# Patient Record
Sex: Female | Born: 1940 | Race: White | Smoking: Never smoker
Health system: Southern US, Community
[De-identification: ages and names within clinical notes are randomized; demographics above are authoritative.]

## PROBLEM LIST (undated history)

## (undated) DIAGNOSIS — I10 Essential (primary) hypertension: Secondary | ICD-10-CM

## (undated) DIAGNOSIS — C801 Malignant (primary) neoplasm, unspecified: Secondary | ICD-10-CM

## (undated) HISTORY — PX: JOINT REPLACEMENT: SHX530

## (undated) HISTORY — PX: ABDOMINAL HYSTERECTOMY: SHX81

## (undated) HISTORY — PX: NEPHRECTOMY: SHX65

---

## 2012-10-08 ENCOUNTER — Ambulatory Visit: Payer: Medicare Other | Attending: Orthopaedic Surgery | Admitting: Physical Therapy

## 2012-10-08 DIAGNOSIS — M6281 Muscle weakness (generalized): Secondary | ICD-10-CM | POA: Insufficient documentation

## 2012-10-08 DIAGNOSIS — M25569 Pain in unspecified knee: Secondary | ICD-10-CM | POA: Insufficient documentation

## 2012-10-08 DIAGNOSIS — IMO0001 Reserved for inherently not codable concepts without codable children: Secondary | ICD-10-CM | POA: Insufficient documentation

## 2012-10-08 DIAGNOSIS — M25669 Stiffness of unspecified knee, not elsewhere classified: Secondary | ICD-10-CM | POA: Insufficient documentation

## 2012-10-08 DIAGNOSIS — R269 Unspecified abnormalities of gait and mobility: Secondary | ICD-10-CM | POA: Insufficient documentation

## 2012-10-13 ENCOUNTER — Ambulatory Visit: Payer: Medicare Other | Attending: Orthopaedic Surgery | Admitting: Physical Therapy

## 2012-10-13 DIAGNOSIS — M25569 Pain in unspecified knee: Secondary | ICD-10-CM | POA: Insufficient documentation

## 2012-10-13 DIAGNOSIS — M6281 Muscle weakness (generalized): Secondary | ICD-10-CM | POA: Insufficient documentation

## 2012-10-13 DIAGNOSIS — M25669 Stiffness of unspecified knee, not elsewhere classified: Secondary | ICD-10-CM | POA: Insufficient documentation

## 2012-10-13 DIAGNOSIS — R269 Unspecified abnormalities of gait and mobility: Secondary | ICD-10-CM | POA: Insufficient documentation

## 2012-10-13 DIAGNOSIS — IMO0001 Reserved for inherently not codable concepts without codable children: Secondary | ICD-10-CM | POA: Insufficient documentation

## 2012-10-16 ENCOUNTER — Ambulatory Visit: Payer: Medicare Other | Admitting: Rehabilitation

## 2012-10-20 ENCOUNTER — Ambulatory Visit: Payer: Medicare Other | Admitting: Rehabilitation

## 2012-10-23 ENCOUNTER — Ambulatory Visit: Payer: Medicare Other | Admitting: Physical Therapy

## 2012-10-27 ENCOUNTER — Ambulatory Visit: Payer: Medicare Other | Admitting: Rehabilitation

## 2012-10-30 ENCOUNTER — Ambulatory Visit: Payer: Medicare Other | Admitting: Physical Therapy

## 2012-11-03 ENCOUNTER — Ambulatory Visit: Payer: Medicare Other | Admitting: Rehabilitation

## 2012-11-06 ENCOUNTER — Ambulatory Visit: Payer: Medicare Other | Admitting: Rehabilitation

## 2012-11-10 ENCOUNTER — Ambulatory Visit: Payer: Medicare Other | Admitting: Physical Therapy

## 2012-11-13 ENCOUNTER — Ambulatory Visit: Payer: Medicare Other | Attending: Orthopaedic Surgery | Admitting: Physical Therapy

## 2012-11-13 DIAGNOSIS — IMO0001 Reserved for inherently not codable concepts without codable children: Secondary | ICD-10-CM | POA: Insufficient documentation

## 2012-11-13 DIAGNOSIS — M25669 Stiffness of unspecified knee, not elsewhere classified: Secondary | ICD-10-CM | POA: Insufficient documentation

## 2012-11-13 DIAGNOSIS — R269 Unspecified abnormalities of gait and mobility: Secondary | ICD-10-CM | POA: Insufficient documentation

## 2012-11-13 DIAGNOSIS — M6281 Muscle weakness (generalized): Secondary | ICD-10-CM | POA: Insufficient documentation

## 2012-11-13 DIAGNOSIS — M25569 Pain in unspecified knee: Secondary | ICD-10-CM | POA: Insufficient documentation

## 2012-11-17 ENCOUNTER — Ambulatory Visit: Payer: Medicare Other | Admitting: Physical Therapy

## 2012-11-20 ENCOUNTER — Ambulatory Visit: Payer: Medicare Other | Admitting: Rehabilitation

## 2012-11-24 ENCOUNTER — Ambulatory Visit: Payer: Medicare Other | Admitting: Physical Therapy

## 2012-11-28 ENCOUNTER — Ambulatory Visit: Payer: Medicare Other | Admitting: Physical Therapy

## 2012-12-02 ENCOUNTER — Ambulatory Visit: Payer: Medicare Other | Admitting: Physical Therapy

## 2012-12-05 ENCOUNTER — Ambulatory Visit: Payer: Medicare Other | Admitting: Physical Therapy

## 2012-12-08 ENCOUNTER — Ambulatory Visit: Payer: Medicare Other | Admitting: Physical Therapy

## 2012-12-11 ENCOUNTER — Ambulatory Visit: Payer: Medicare Other | Admitting: Physical Therapy

## 2018-04-19 ENCOUNTER — Observation Stay (HOSPITAL_COMMUNITY): Payer: Medicare Other

## 2018-04-19 ENCOUNTER — Inpatient Hospital Stay (HOSPITAL_BASED_OUTPATIENT_CLINIC_OR_DEPARTMENT_OTHER)
Admission: EM | Admit: 2018-04-19 | Discharge: 2018-04-22 | DRG: 313 | Disposition: A | Discharge status: Home / Self Care | Payer: Medicare Other | Attending: Internal Medicine | Admitting: Internal Medicine

## 2018-04-19 ENCOUNTER — Emergency Department (HOSPITAL_BASED_OUTPATIENT_CLINIC_OR_DEPARTMENT_OTHER): Payer: Medicare Other

## 2018-04-19 ENCOUNTER — Other Ambulatory Visit: Payer: Self-pay

## 2018-04-19 ENCOUNTER — Encounter (HOSPITAL_BASED_OUTPATIENT_CLINIC_OR_DEPARTMENT_OTHER): Payer: Self-pay | Admitting: Emergency Medicine

## 2018-04-19 ENCOUNTER — Observation Stay (HOSPITAL_BASED_OUTPATIENT_CLINIC_OR_DEPARTMENT_OTHER): Payer: Medicare Other

## 2018-04-19 DIAGNOSIS — R109 Unspecified abdominal pain: Secondary | ICD-10-CM | POA: Diagnosis present

## 2018-04-19 DIAGNOSIS — R079 Chest pain, unspecified: Secondary | ICD-10-CM

## 2018-04-19 DIAGNOSIS — Z85528 Personal history of other malignant neoplasm of kidney: Secondary | ICD-10-CM

## 2018-04-19 DIAGNOSIS — Z888 Allergy status to other drugs, medicaments and biological substances status: Secondary | ICD-10-CM | POA: Diagnosis not present

## 2018-04-19 DIAGNOSIS — Z91040 Latex allergy status: Secondary | ICD-10-CM | POA: Diagnosis not present

## 2018-04-19 DIAGNOSIS — R739 Hyperglycemia, unspecified: Secondary | ICD-10-CM | POA: Diagnosis not present

## 2018-04-19 DIAGNOSIS — M549 Dorsalgia, unspecified: Secondary | ICD-10-CM | POA: Diagnosis present

## 2018-04-19 DIAGNOSIS — Z885 Allergy status to narcotic agent status: Secondary | ICD-10-CM

## 2018-04-19 DIAGNOSIS — Z905 Acquired absence of kidney: Secondary | ICD-10-CM

## 2018-04-19 DIAGNOSIS — K760 Fatty (change of) liver, not elsewhere classified: Secondary | ICD-10-CM | POA: Diagnosis not present

## 2018-04-19 DIAGNOSIS — Z79899 Other long term (current) drug therapy: Secondary | ICD-10-CM | POA: Diagnosis not present

## 2018-04-19 DIAGNOSIS — N1330 Unspecified hydronephrosis: Secondary | ICD-10-CM | POA: Diagnosis present

## 2018-04-19 DIAGNOSIS — I129 Hypertensive chronic kidney disease with stage 1 through stage 4 chronic kidney disease, or unspecified chronic kidney disease: Secondary | ICD-10-CM | POA: Diagnosis not present

## 2018-04-19 DIAGNOSIS — K824 Cholesterolosis of gallbladder: Secondary | ICD-10-CM | POA: Diagnosis not present

## 2018-04-19 DIAGNOSIS — F439 Reaction to severe stress, unspecified: Secondary | ICD-10-CM | POA: Diagnosis not present

## 2018-04-19 DIAGNOSIS — Z9660 Presence of unspecified orthopedic joint implant: Secondary | ICD-10-CM | POA: Diagnosis not present

## 2018-04-19 DIAGNOSIS — R1011 Right upper quadrant pain: Secondary | ICD-10-CM | POA: Diagnosis present

## 2018-04-19 DIAGNOSIS — N183 Chronic kidney disease, stage 3 (moderate): Secondary | ICD-10-CM | POA: Diagnosis present

## 2018-04-19 DIAGNOSIS — R0789 Other chest pain: Secondary | ICD-10-CM | POA: Diagnosis not present

## 2018-04-19 DIAGNOSIS — Z881 Allergy status to other antibiotic agents status: Secondary | ICD-10-CM | POA: Diagnosis not present

## 2018-04-19 DIAGNOSIS — R112 Nausea with vomiting, unspecified: Secondary | ICD-10-CM | POA: Diagnosis not present

## 2018-04-19 DIAGNOSIS — K759 Inflammatory liver disease, unspecified: Secondary | ICD-10-CM | POA: Diagnosis not present

## 2018-04-19 DIAGNOSIS — K219 Gastro-esophageal reflux disease without esophagitis: Secondary | ICD-10-CM | POA: Diagnosis not present

## 2018-04-19 HISTORY — DX: Malignant (primary) neoplasm, unspecified: C80.1

## 2018-04-19 HISTORY — DX: Essential (primary) hypertension: I10

## 2018-04-19 LAB — CBC WITH DIFFERENTIAL/PLATELET
Abs Immature Granulocytes: 0.01 10*3/uL (ref 0.00–0.07)
Basophils Absolute: 0.1 10*3/uL (ref 0.0–0.1)
Basophils Relative: 1 %
EOS PCT: 2 %
Eosinophils Absolute: 0.1 10*3/uL (ref 0.0–0.5)
HCT: 41.2 % (ref 36.0–46.0)
HEMOGLOBIN: 12.9 g/dL (ref 12.0–15.0)
Immature Granulocytes: 0 %
Lymphocytes Relative: 27 %
Lymphs Abs: 1.4 10*3/uL (ref 0.7–4.0)
MCH: 27.7 pg (ref 26.0–34.0)
MCHC: 31.3 g/dL (ref 30.0–36.0)
MCV: 88.6 fL (ref 80.0–100.0)
Monocytes Absolute: 0.5 10*3/uL (ref 0.1–1.0)
Monocytes Relative: 9 %
NRBC: 0 % (ref 0.0–0.2)
Neutro Abs: 3.2 10*3/uL (ref 1.7–7.7)
Neutrophils Relative %: 61 %
Platelets: 274 10*3/uL (ref 150–400)
RBC: 4.65 MIL/uL (ref 3.87–5.11)
RDW: 13.1 % (ref 11.5–15.5)
WBC: 5.3 10*3/uL (ref 4.0–10.5)

## 2018-04-19 LAB — LIPASE, BLOOD: LIPASE: 40 U/L (ref 11–51)

## 2018-04-19 LAB — COMPREHENSIVE METABOLIC PANEL
ALT: 13 U/L (ref 0–44)
AST: 17 U/L (ref 15–41)
Albumin: 4.1 g/dL (ref 3.5–5.0)
Alkaline Phosphatase: 65 U/L (ref 38–126)
Anion gap: 9 (ref 5–15)
BUN: 24 mg/dL — ABNORMAL HIGH (ref 8–23)
CO2: 20 mmol/L — AB (ref 22–32)
Calcium: 9.2 mg/dL (ref 8.9–10.3)
Chloride: 108 mmol/L (ref 98–111)
Creatinine, Ser: 1.43 mg/dL — ABNORMAL HIGH (ref 0.44–1.00)
GFR calc Af Amer: 41 mL/min — ABNORMAL LOW (ref >60)
GFR calc non Af Amer: 36 mL/min — ABNORMAL LOW (ref >60)
Glucose, Bld: 102 mg/dL — ABNORMAL HIGH (ref 70–99)
Potassium: 3.9 mmol/L (ref 3.5–5.1)
Sodium: 137 mmol/L (ref 135–145)
Total Bilirubin: 0.9 mg/dL (ref 0.3–1.2)
Total Protein: 7.2 g/dL (ref 6.5–8.1)

## 2018-04-19 LAB — PHOSPHORUS: Phosphorus: 4.6 mg/dL (ref 2.5–4.6)

## 2018-04-19 LAB — TROPONIN I
Troponin I: <0.03 ng/mL (ref <0.03)
Troponin I: <0.03 ng/mL (ref <0.03)

## 2018-04-19 LAB — TSH: TSH: 2.484 u[IU]/mL (ref 0.350–4.500)

## 2018-04-19 LAB — D-DIMER, QUANTITATIVE: D-Dimer, Quant: 0.4 ug/mL-FEU (ref 0.00–0.50)

## 2018-04-19 LAB — MAGNESIUM: Magnesium: 2.2 mg/dL (ref 1.7–2.4)

## 2018-04-19 MED ORDER — ONDANSETRON HCL 4 MG/2ML IJ SOLN: 4.0000 mg | Freq: Four times a day (QID) | INTRAMUSCULAR | Status: DC | PRN

## 2018-04-19 MED ORDER — SENNOSIDES-DOCUSATE SODIUM 8.6-50 MG PO TABS
1.0000 | ORAL_TABLET | Freq: Every evening | ORAL | Status: DC | PRN
  Administered 2018-04-21: 1 via ORAL
  Filled 2018-04-19: qty 1

## 2018-04-19 MED ORDER — MORPHINE SULFATE (PF) 4 MG/ML IV SOLN
4.0000 mg | Freq: Once | INTRAVENOUS | Status: AC
  Administered 2018-04-19: 4 mg via INTRAVENOUS
  Filled 2018-04-19: qty 1

## 2018-04-19 MED ORDER — ACETAMINOPHEN 325 MG PO TABS: 650.0000 mg | ORAL_TABLET | Freq: Four times a day (QID) | ORAL | Status: DC | PRN

## 2018-04-19 MED ORDER — ONDANSETRON HCL 4 MG/2ML IJ SOLN
4.0000 mg | Freq: Once | INTRAMUSCULAR | Status: AC
  Administered 2018-04-19: 4 mg via INTRAVENOUS
  Filled 2018-04-19: qty 2

## 2018-04-19 MED ORDER — OXYCODONE HCL 5 MG PO TABS
5.0000 mg | ORAL_TABLET | ORAL | Status: DC | PRN
  Administered 2018-04-19 – 2018-04-21 (×6): 5 mg via ORAL
  Filled 2018-04-19 (×7): qty 1

## 2018-04-19 MED ORDER — BISACODYL 10 MG RE SUPP: 10.0000 mg | Freq: Every day | RECTAL | Status: DC | PRN

## 2018-04-19 MED ORDER — ENOXAPARIN SODIUM 40 MG/0.4ML ~~LOC~~ SOLN: 40.0000 mg | Freq: Every day | SUBCUTANEOUS | Status: DC

## 2018-04-19 MED ORDER — ONDANSETRON HCL 4 MG PO TABS: 4.0000 mg | ORAL_TABLET | Freq: Four times a day (QID) | ORAL | Status: DC | PRN

## 2018-04-19 MED ORDER — PROMETHAZINE HCL 25 MG/ML IJ SOLN
12.5000 mg | Freq: Once | INTRAMUSCULAR | Status: AC
  Administered 2018-04-19: 12.5 mg via INTRAVENOUS
  Filled 2018-04-19: qty 1

## 2018-04-19 MED ORDER — HEPARIN SODIUM (PORCINE) 5000 UNIT/ML IJ SOLN
5000.0000 [IU] | Freq: Three times a day (TID) | INTRAMUSCULAR | Status: DC
  Administered 2018-04-19 – 2018-04-21 (×5): 5000 [IU] via SUBCUTANEOUS
  Filled 2018-04-19 (×7): qty 1

## 2018-04-19 MED ORDER — SODIUM CHLORIDE 0.9 % IV SOLN
INTRAVENOUS | Status: DC
  Administered 2018-04-19 – 2018-04-21 (×4): via INTRAVENOUS

## 2018-04-19 MED ORDER — ACETAMINOPHEN 650 MG RE SUPP: 650.0000 mg | Freq: Four times a day (QID) | RECTAL | Status: DC | PRN

## 2018-04-19 MED ORDER — ONDANSETRON HCL 4 MG/2ML IJ SOLN
4.0000 mg | Freq: Four times a day (QID) | INTRAMUSCULAR | Status: DC
  Administered 2018-04-20 – 2018-04-21 (×8): 4 mg via INTRAVENOUS
  Filled 2018-04-19 (×10): qty 2

## 2018-04-19 NOTE — ED Provider Notes (Signed)
[facility] EMERGENCY DEPARTMENT Provider Note   CSN: [id number] Arrival date & time: 04/19/18  1443     History   Chief Complaint Chief Complaint  Patient presents with  . Chest Pain    HPI Shelley Freeman is a 77 y.o. female.  She is presenting with some right-sided anterior chest pain radiating through to her back is been going on and off since Thanksgiving.  She said over the last few days it is been more constant.  It does not associated with shortness of breath and it does not seem to be related to exertion or eating.  She rates the pain is 8 out of 10.  She calls it uncomfortable.  She is never had this before.  No trauma.  No fevers or chills.  No nausea no vomiting no diarrhea no urinary symptoms.  She said she has had a mammogram recently and saw her PCP and was told everything was fine.  She does not notice any symptoms related to the breast that is deeper down inside on her right lower chest.  The history is provided by the patient.  Chest Pain   This is a new problem. The current episode started more than 1 week ago. The problem occurs constantly. The problem has not changed since onset.The pain is present in the lateral region. The pain is at a severity of 8/10. Quality: uncomfortable. The pain radiates to the mid back. Exacerbated by: nothing. Pertinent negatives include no abdominal pain, no cough, no diaphoresis, no exertional chest pressure, no fever, no leg pain, no lower extremity edema, no nausea, no numbness, no shortness of breath and no vomiting. She has tried nothing for the symptoms. The treatment provided no relief.  Her past medical history is significant for cancer.    Past Medical History:  Diagnosis Date  . Cancer ([place])   . Hypertension     There are no active problems to display for this patient.   Past Surgical History:  Procedure Laterality Date  . ABDOMINAL HYSTERECTOMY    . JOINT REPLACEMENT    . NEPHRECTOMY Left      OB History     None      Home Medications    Prior to Admission medications   Not on File    Family History No family history on file.  Social History Social History   Tobacco Use  . Smoking status: Never Smoker  . Smokeless tobacco: Never Used  Substance Use Topics  . Alcohol use: Never    Frequency: Never  . Drug use: Never     Allergies   Codeine; Demerol [meperidine hcl]; and Lipitor [atorvastatin calcium]   Review of Systems Review of Systems  Constitutional: Negative for diaphoresis and fever.  HENT: Negative for sore throat.   Eyes: Negative for visual disturbance.  Respiratory: Negative for cough and shortness of breath.   Cardiovascular: Positive for chest pain.  Gastrointestinal: Negative for abdominal pain, nausea and vomiting.  Genitourinary: Negative for dysuria.  Musculoskeletal: Negative for neck pain.  Skin: Negative for rash.  Neurological: Negative for numbness.     Physical Exam Updated Vital Signs BP (!) 153/76   Pulse 65   Temp 98.3 F (36.8 C) (Oral)   Resp 19   Ht 5\' 5"  (1.651 m)   Wt 68 kg   SpO2 98%   BMI 24.96 kg/m   Physical Exam  Constitutional: She appears well-developed and well-nourished. No distress.  HENT:  Head: Normocephalic and atraumatic.  Eyes: Conjunctivae are normal.  Neck: Neck supple.  Cardiovascular: Normal rate and regular rhythm.  No murmur heard. Pulmonary/Chest: Effort normal and breath sounds normal. No respiratory distress.  Abdominal: Soft. There is tenderness (mild) in the right upper quadrant. There is no rigidity, no guarding, no tenderness at McBurney's point and negative Murphy's sign.  Musculoskeletal: She exhibits no edema.       Right lower leg: She exhibits no tenderness and no edema.       Left lower leg: She exhibits no tenderness and no edema.  Neurological: She is alert.  Skin: Skin is warm and dry.  Psychiatric: She has a normal mood and affect.  Nursing note and vitals reviewed.    ED  Treatments / Results  Labs (all labs ordered are listed, but only abnormal results are displayed) Labs Reviewed  COMPREHENSIVE METABOLIC PANEL - Abnormal; Notable for the following components:      Result Value   CO2 20 (*)    Glucose, Bld 102 (*)    BUN 24 (*)    Creatinine, Ser 1.43 (*)    GFR calc non Af Amer 36 (*)    GFR calc Af Amer 41 (*)    All other components within normal limits  TROPONIN I  CBC WITH DIFFERENTIAL/PLATELET  LIPASE, BLOOD  TROPONIN I  D-DIMER, QUANTITATIVE (NOT AT [facility])  URINALYSIS, ROUTINE W REFLEX MICROSCOPIC  TROPONIN I    EKG EKG Interpretation  Date/Time:  Saturday April 19 2018 08:48:48 EST Ventricular Rate:  57 PR Interval:    QRS Duration: 98 QT Interval:  448 QTC Calculation: 437 R Axis:   1 Text Interpretation:  Sinus rhythm Inferior infarct, old no prior to compare with Confirmed by Aletta Edouard [phone]) on 04/19/2018 9:00:08 AM   Radiology Ct Abdomen Pelvis Wo Contrast  Result Date: 04/19/2018 CLINICAL DATA:  Right upper quadrant and flank pain. EXAM: CT ABDOMEN AND PELVIS WITHOUT CONTRAST TECHNIQUE: Multidetector CT imaging of the abdomen and pelvis was performed following the standard protocol without IV contrast. COMPARISON:  Right upper quadrant ultrasound from same day. CT abdomen pelvis dated October 25, 2017. FINDINGS: Lower chest: No acute abnormality. Subsegmental atelectasis in both lower lobes. Hepatobiliary: No focal liver abnormality is seen. No gallstones, gallbladder wall thickening, or biliary dilatation. Pancreas: Unremarkable. No pancreatic ductal dilatation or surrounding inflammatory changes. Spleen: Normal in size without focal abnormality. Adrenals/Urinary Tract: The adrenal glands are unremarkable. Prior left nephrectomy. Unchanged right renal cysts. Fullness of the right renal collecting system to the level of the UPJ is unchanged since 2017. No renal or ureteral calculi. No hydronephrosis. The bladder is  unremarkable. Stomach/Bowel: Stomach is within normal limits. Appendix appears normal. No evidence of bowel wall thickening, distention, or inflammatory changes. Vascular/Lymphatic: Aortic atherosclerosis. No enlarged abdominal or pelvic lymph nodes. Reproductive: Status post hysterectomy. No adnexal masses. Other: No free fluid or pneumoperitoneum. Musculoskeletal: No acute or significant osseous findings. IMPRESSION: 1.  No acute intra-abdominal process. 2. Fullness of the right renal collecting system to the level of the UPJ is unchanged since 2017. No obstructive uropathy. 3.  Aortic atherosclerosis (ICD10-I70.0). Electronically Signed   By: Titus Dubin M.D.   On: 04/19/2018 13:08   Dg Chest 2 View  Result Date: 04/19/2018 CLINICAL DATA:  Right-sided chest pain for 9 days. EXAM: CHEST - 2 VIEW COMPARISON:  None. FINDINGS: The heart size and mediastinal contours are within normal limits. Both lungs are clear. The visualized skeletal structures are unremarkable. IMPRESSION: No active  cardiopulmonary disease. Electronically Signed   By: Earle Gell M.D.   On: 04/19/2018 09:51   US Abdomen Limited Ruq  Result Date: 04/19/2018 CLINICAL DATA:  Epigastric abdominal pain. EXAM: ULTRASOUND ABDOMEN LIMITED RIGHT UPPER QUADRANT COMPARISON:  None. FINDINGS: Gallbladder: Suspect polyp or fold near the gallbladder neck. No shadowing gallstones or findings for acute cholecystitis. Common bile duct: Diameter: 7.0 mm Liver: Somewhat heterogeneous coarse liver echogenicity but no focal hepatic lesions or intrahepatic biliary dilatation. Other: The right kidney demonstrates hydronephrosis. There are also simple appearing right renal cysts. Portal vein is patent on color Doppler imaging with normal direction of blood flow towards the liver. IMPRESSION: 1. Suspect polyp or fold near the gallbladder neck but no shadowing gallstones or findings for acute cholecystitis. 2. Normal caliber common bile duct for age. 3.  Somewhat heterogeneous coarse liver echogenicity could be due to fatty infiltration or hepatitis. No obvious changes of cirrhosis. 4. Right-sided hydronephrosis of uncertain etiology. Electronically Signed   By: Marijo Sanes M.D.   On: 04/19/2018 11:50    Procedures Procedures (including critical care time)  Medications Ordered in ED Medications  oxyCODONE (Oxy IR/ROXICODONE) immediate release tablet 5 mg (has no administration in time range)  ondansetron (ZOFRAN) injection 4 mg (has no administration in time range)  morphine 4 MG/ML injection 4 mg (4 mg Intravenous Given 04/19/18 0908)  ondansetron (ZOFRAN) injection 4 mg (4 mg Intravenous Given 04/19/18 0908)  morphine 4 MG/ML injection 4 mg (4 mg Intravenous Given 04/19/18 1252)  ondansetron (ZOFRAN) injection 4 mg (4 mg Intravenous Given 04/19/18 1252)  morphine 4 MG/ML injection 4 mg (4 mg Intravenous Given 04/19/18 1434)  ondansetron (ZOFRAN) injection 4 mg (4 mg Intravenous Given 04/19/18 1434)  promethazine (PHENERGAN) injection 12.5 mg (12.5 mg Intravenous Given 04/19/18 1529)     Initial Impression / Assessment and Plan / ED Course  I have reviewed the triage vital signs and the nursing notes.  Pertinent labs & imaging results that were available during my care of the patient were reviewed by me and considered in my medical decision making (see chart for details).  Clinical Course as of Apr 19 1709  Sat Apr 19, 2632  6056 77 year old female with remote history of renal cell cancer here with right-sided chest pain that is been going on for over a week but more steady over the past few days.  Seems to be centered beneath her right breast and it radiates into her back.  She is got some element of some right upper quadrant tenderness and this could be related to her gallbladder although she will need further cardiac testing and a chest x-ray to rule out pulmonary processes   [MB]  1007 Patient's creatinine is a little bit elevated at  1.43 although in review of care everywhere that seems to be her new baseline recently.  Otherwise her LFTs are normal troponin normal.  I put her in for right upper quadrant ultrasound.   [MB]  3810 I reviewed the patient's findings with her.  I think the next step would be to do a CT although she will not be able to have contrast due to her decreased kidney function and solitary kidney.  She understands this and is in agreement to undergo the noncontrast CT.   [MB]  1408 Discussed with Dr. Oletta Lamas from [facility].  He agree that there may be indications to do a HIDA scan but does not think would be available over the weekend.  He would  have the patient call his office on Monday to be seen and they would work on arranging this.   [MB]  6010 Had a long discussion with the patient and she understands that organ to try to control her pain as an outpatient and have her follow-up with GI on Monday.  She understands if her symptoms worsen or she experiences any symptoms that are concerning that she should return to the emergency department.  I let her know that she may be better served by going to [facility] or [facility] as they will be able to have for options available for further testing.   [MB]  1459 Patient is still having pain despite another dose of morphine.  Still right anterior lower chest and through to her scapula.  I have put a call into the hospitalist to discuss admission for her.   [MB]  1521 discussed with Dr. Loleta Books at [facility] long hospitalist.  He is accepting the patient for admission at [facility] long and asked if we would stop giving IV pain medicine and convert her over to oral pain medicine and get a d-dimer.   [MB]    Clinical Course User Index [MB] Hayden Rasmussen, MD    Final Clinical Impressions(s) / ED Diagnoses   Final diagnoses:  Right-sided chest pain    ED Discharge Orders    None       Hayden Rasmussen, MD 04/19/18 1711

## 2018-04-19 NOTE — ED Triage Notes (Signed)
Pain to right chest area, worse since yesterday. On and off since Thanksgiving, pain goes to back

## 2018-04-19 NOTE — ED Notes (Signed)
Pt unable to provide urine sample at this time. Provided water as requested

## 2018-04-19 NOTE — ED Notes (Signed)
Pt ambulatory to [facility] with assistance

## 2018-04-19 NOTE — Progress Notes (Signed)
[facility] Transfer Accept Note  Shelley Freeman is a 77 y.o. F with HTN, RCC s/p L nephrectomy and CKD III baseline Cr 1.3-1.5 who presents with several weeks progressive right sided chest pain.  Patient in [place] until a few weeks ago. Started to notice right sided chest pain, vague character, but radiating to scapula, positional (worse lying down).  NOT exertional nor particularly food related.    Now has become severe, more constant.     CXR negative.  Afebrile. RR and SpO2 normal.  Troponin initial normal.  ECG sinus rhythm, no real ST changes. Cr at baseline (1.4).  LFTs and lipase normal.   RUQ US showed gallbladder polyp only, mild echogenicity of liver, and mild hydronephrosis on RIGHT, but follow up CT without contrast showed stable known "fullness" of right ureter, NO obstruction or hydro.  Patient requiring multiple doses of morphine in ER, pain etiology unclear.  With cholecystitis, pancreatitis, renal stone all ruled out, differential I believe includes PE, x-ray negative pneumonia.  Probably less likely pericarditis, even less likely ACS.  I recommend d-dimer and transfer for OBS, further eval.    If dimer positive, will need empiric AC and VQ scan.  If PE ruled out, will need echo and CT chest wo cm.

## 2018-04-19 NOTE — H&P (Signed)
History and Physical    Shelley Freeman LFY:[id number] DOB: 07-30-1940 DOA: 04/19/2018  PCP: Burman Freestone, MD   Patient coming from: Home  Chief Complaint: Intractable Right Sided Chest Pain  HPI: Shelley Freeman is a 77 y.o. female with medical history significant of renal cell carcinoma status post left nephrectomy, chronic kidney disease stage III, history of GERD who takes intermittent omeprazole, and other comorbidities who presented to the emergency room with a chief complaint of intractable right-sided chest pain that started off his abdominal pain.  Patient states that after he ate Thanksgiving dinner symptoms all started and chest pain in the gastric pain persisted and is constant.  She states that the pain is now settled in the right side of chest and radiates to the back.  She denies any nausea or vomiting.  And she has tried omeprazole without relief.  Because of the pain persisting it was a 7-8 out of 10 she presented to the emergency room for further evaluation and work-up.  [facility] was called to admit this patient for right-sided chest pain  ED Course: In the ED she had basic blood work done, was given 12 mg of IV morphine, and 5 mg of p.o. oxycodone, given dose of Zofran and Phenergan, and had an ultrasound of her abdomen as well as a CT of the abdomen and pelvis  Review of Systems: As per HPI otherwise 10 point review of systems negative.   Past Medical History:  Diagnosis Date  . Cancer ([place])   . Hypertension    Past Surgical History:  Procedure Laterality Date  . ABDOMINAL HYSTERECTOMY    . JOINT REPLACEMENT    . NEPHRECTOMY Left    SOCIAL HISTORY  reports that she has never smoked. She has never used smokeless tobacco. She reports that she does not drink alcohol or use drugs.  Allergies  Allergen Reactions  . Codeine Nausea Only  . Demerol [Meperidine Hcl] Nausea Only  . Lipitor [Atorvastatin Calcium] Other (See Comments)    Muscle weakness   FAMILY HISTORY History  reviewed. No pertinent family history to patient's presenting complaints  Prior to Admission medications   Not on File   Physical Exam: Vitals:   04/19/18 1113 04/19/18 1356 04/19/18 1531 04/19/18 1700  BP: 140/68 114/61 (!) 119/53 129/62  Pulse: (!) 55 (!) 51  64  Resp: 15 13 12 14   Temp: 97.9 F (36.6 C) 97.7 F (36.5 C)  97.7 F (36.5 C)  TempSrc: Oral Oral  Oral  SpO2: 100% 94%  98%  Weight:    70.8 kg  Height:    5\' 5"  (1.651 m)   Constitutional: WN/WD Caucasian female in NAD and appears calm and comfortable but complaining of Right Sided Chest Pain 7/10 Eyes: Lids and conjunctivae normal, sclerae anicteric  ENMT: External Ears, Nose appear normal. Grossly normal hearing.  Neck: Appears normal, supple, no cervical masses, normal ROM, no appreciable thyromegaly; no JVD Respiratory: Diminished to auscultation bilaterally, no wheezing, rales, rhonchi or crackles. Normal respiratory effort and patient is not tachypenic. No accessory muscle use.  Cardiovascular: RRR, no murmurs / rubs / gallops. S1 and S2 auscultated. No extremity edema.  Abdomen: Soft, non-tender, non-distended. No masses palpated. No appreciable hepatosplenomegaly. Bowel sounds positive x4.  GU: Deferred. Musculoskeletal: No clubbing / cyanosis of digits/nails. .  Skin: No rashes, lesions, ulcers on a limited skin evaluation. No induration; Warm and dry.  Neurologic: CN 2-12 grossly intact with no focal deficits. Romberg sign and cerebellar reflexes  not assessed.  Psychiatric: Normal judgment and insight. Alert and oriented x 3. Normal mood and appropriate affect.   Labs on Admission: I have personally reviewed following labs and imaging studies  CBC: Recent Labs  Lab 04/19/18 0854  WBC 5.3  NEUTROABS 3.2  HGB 12.9  HCT 41.2  MCV 88.6  PLT 660   Basic Metabolic Panel: Recent Labs  Lab 04/19/18 0854  NA 137  K 3.9  CL 108  CO2 20*  GLUCOSE 102*  BUN 24*  CREATININE 1.43*  CALCIUM 9.2    GFR: Estimated Creatinine Clearance: 33.5 mL/min (A) (by C-G formula based on SCr of 1.43 mg/dL (H)). Liver Function Tests: Recent Labs  Lab 04/19/18 0854  AST 17  ALT 13  ALKPHOS 65  BILITOT 0.9  PROT 7.2  ALBUMIN 4.1   Recent Labs  Lab 04/19/18 0854  LIPASE 40   No results for input(s): AMMONIA in the last 168 hours. Coagulation Profile: No results for input(s): INR, PROTIME in the last 168 hours. Cardiac Enzymes: Recent Labs  Lab 04/19/18 0854 04/19/18 1524  TROPONINI <0.03 <0.03   BNP (last 3 results) No results for input(s): PROBNP in the last 8760 hours. HbA1C: No results for input(s): HGBA1C in the last 72 hours. CBG: No results for input(s): GLUCAP in the last 168 hours. Lipid Profile: No results for input(s): CHOL, HDL, LDLCALC, TRIG, CHOLHDL, LDLDIRECT in the last 72 hours. Thyroid Function Tests: No results for input(s): TSH, T4TOTAL, FREET4, T3FREE, THYROIDAB in the last 72 hours. Anemia Panel: No results for input(s): VITAMINB12, FOLATE, FERRITIN, TIBC, IRON, RETICCTPCT in the last 72 hours. Urine analysis: No results found for: COLORURINE, APPEARANCEUR, LABSPEC, PHURINE, GLUCOSEU, HGBUR, BILIRUBINUR, KETONESUR, PROTEINUR, UROBILINOGEN, NITRITE, LEUKOCYTESUR Sepsis Labs: !!!!!!!!!!!!!!!!!!!!!!!!!!!!!!!!!!!!!!!!!!!! @LABRCNTIP (procalcitonin:4,lacticidven:4) )No results found for this or any previous visit (from the past 240 hour(s)).   Radiological Exams on Admission: Ct Abdomen Pelvis Wo Contrast  Result Date: 04/19/2018 CLINICAL DATA:  Right upper quadrant and flank pain. EXAM: CT ABDOMEN AND PELVIS WITHOUT CONTRAST TECHNIQUE: Multidetector CT imaging of the abdomen and pelvis was performed following the standard protocol without IV contrast. COMPARISON:  Right upper quadrant ultrasound from same day. CT abdomen pelvis dated October 25, 2017. FINDINGS: Lower chest: No acute abnormality. Subsegmental atelectasis in both lower lobes. Hepatobiliary: No  focal liver abnormality is seen. No gallstones, gallbladder wall thickening, or biliary dilatation. Pancreas: Unremarkable. No pancreatic ductal dilatation or surrounding inflammatory changes. Spleen: Normal in size without focal abnormality. Adrenals/Urinary Tract: The adrenal glands are unremarkable. Prior left nephrectomy. Unchanged right renal cysts. Fullness of the right renal collecting system to the level of the UPJ is unchanged since 2017. No renal or ureteral calculi. No hydronephrosis. The bladder is unremarkable. Stomach/Bowel: Stomach is within normal limits. Appendix appears normal. No evidence of bowel wall thickening, distention, or inflammatory changes. Vascular/Lymphatic: Aortic atherosclerosis. No enlarged abdominal or pelvic lymph nodes. Reproductive: Status post hysterectomy. No adnexal masses. Other: No free fluid or pneumoperitoneum. Musculoskeletal: No acute or significant osseous findings. IMPRESSION: 1.  No acute intra-abdominal process. 2. Fullness of the right renal collecting system to the level of the UPJ is unchanged since 2017. No obstructive uropathy. 3.  Aortic atherosclerosis (ICD10-I70.0). Electronically Signed   By: Titus Dubin M.D.   On: 04/19/2018 13:08   Dg Chest 2 View  Result Date: 04/19/2018 CLINICAL DATA:  Right-sided chest pain for 9 days. EXAM: CHEST - 2 VIEW COMPARISON:  None. FINDINGS: The heart size and mediastinal contours are within normal  limits. Both lungs are clear. The visualized skeletal structures are unremarkable. IMPRESSION: No active cardiopulmonary disease. Electronically Signed   By: Earle Gell M.D.   On: 04/19/2018 09:51   US Abdomen Limited Ruq  Result Date: 04/19/2018 CLINICAL DATA:  Epigastric abdominal pain. EXAM: ULTRASOUND ABDOMEN LIMITED RIGHT UPPER QUADRANT COMPARISON:  None. FINDINGS: Gallbladder: Suspect polyp or fold near the gallbladder neck. No shadowing gallstones or findings for acute cholecystitis. Common bile duct: Diameter:  7.0 mm Liver: Somewhat heterogeneous coarse liver echogenicity but no focal hepatic lesions or intrahepatic biliary dilatation. Other: The right kidney demonstrates hydronephrosis. There are also simple appearing right renal cysts. Portal vein is patent on color Doppler imaging with normal direction of blood flow towards the liver. IMPRESSION: 1. Suspect polyp or fold near the gallbladder neck but no shadowing gallstones or findings for acute cholecystitis. 2. Normal caliber common bile duct for age. 3. Somewhat heterogeneous coarse liver echogenicity could be due to fatty infiltration or hepatitis. No obvious changes of cirrhosis. 4. Right-sided hydronephrosis of uncertain etiology. Electronically Signed   By: Marijo Sanes M.D.   On: 04/19/2018 11:50   EKG: Independently reviewed. Showed Sinus Bradycardia at a rate of 57. No evidenc of ST Elevation on my interpretation  Assessment/Plan Active Problems:   Chest pain  Right Sided Chest Pain and Radiation to the back/Recent Gastric Pain  -Place in Observation Telemetry -Patient has Tried Omeprazole with no relief -Symptoms started after Thanksgiving dinner and persisted  -Lipase and LFT's normal -Does not have a rash and no evidence of Shingles but could be starting; Will ask about Chicken Pox  -D-Dimer Normal 0.40 -Check CT of the Chest w/o Contrast -Check ECHOCardiogram -RUQ U/S Showed gallbladder polyp or fold only, mild echogenicity of liver from fatty infiltration or hepatitis, and mild hydronephrosis on RIGHT -CT Abd/Pelvis showed acute intra-abdominal process.  Fullness of the right renal collecting system to the level of the UPJ was unchanged since 2017 and no obstructive uropathy.  There is also aortic atherosclerosis noted -? May need HIDA and gastroenterology Dr. Oletta Lamas from [facility] was contacted and he agrees there may be indication to do a HIDA scan but does not think it is available over this week and he is arranging the patient  call his office on Monday to be seen and would be working on arranging this -Cycle Cardiac Troponins, First two were <0.03  -Pain Control with Oxycodone -Fluid hydration with normal saline rate to 75 mils per hour  CKD Stage 3 -Baseline Cr is 1.3-15 -Cr on Admission was 1.43 and BUN was 24 -Gentle IVF Hydration -Follow up CMP in AM   Hx of RCC s/p Left Nephrectomy -Follow up as an outpatient  Hyperglycemia -Check HbA1c -Continue to Monitor Blood Sugars and place on SSI if consistently elevated  DVT prophylaxis: Heparin 5,000 units sq q8h Code Status: FULL CODE Family Communication: No family present at bedside Disposition Plan: Anticipate D/C Consults called: Enterology Dr. Oletta Lamas was called by EDP Admission status: Observation telemetry  Severity of Illness: The appropriate patient status for this patient is OBSERVATION. Observation status is judged to be reasonable and necessary in order to provide the required intensity of service to ensure the patient's safety. The patient's presenting symptoms, physical exam findings, and initial radiographic and laboratory data in the context of their medical condition is felt to place them at decreased risk for further clinical deterioration. Furthermore, it is anticipated that the patient will be medically stable for discharge from the  hospital within 2 midnights of admission. The following factors support the patient status of observation.   " The patient's presenting symptoms include right-sided chest pain radiating to back. " The physical exam findings include a slow heart rate slightly. " The initial radiographic and laboratory data are reassuring.  Kerney Elbe, D.O. [facility] is on [place]  If 7PM-7AM, please contact night-coverage [email or web address] Password [facility]  04/19/2018, 5:31 PM

## 2018-04-20 ENCOUNTER — Observation Stay (HOSPITAL_BASED_OUTPATIENT_CLINIC_OR_DEPARTMENT_OTHER): Payer: Medicare Other

## 2018-04-20 DIAGNOSIS — F439 Reaction to severe stress, unspecified: Secondary | ICD-10-CM | POA: Diagnosis present

## 2018-04-20 DIAGNOSIS — Z905 Acquired absence of kidney: Secondary | ICD-10-CM | POA: Diagnosis not present

## 2018-04-20 DIAGNOSIS — R112 Nausea with vomiting, unspecified: Secondary | ICD-10-CM | POA: Diagnosis present

## 2018-04-20 DIAGNOSIS — N1330 Unspecified hydronephrosis: Secondary | ICD-10-CM | POA: Diagnosis present

## 2018-04-20 DIAGNOSIS — R739 Hyperglycemia, unspecified: Secondary | ICD-10-CM | POA: Diagnosis present

## 2018-04-20 DIAGNOSIS — K219 Gastro-esophageal reflux disease without esophagitis: Secondary | ICD-10-CM | POA: Diagnosis present

## 2018-04-20 DIAGNOSIS — I351 Nonrheumatic aortic (valve) insufficiency: Secondary | ICD-10-CM | POA: Diagnosis not present

## 2018-04-20 DIAGNOSIS — Z91040 Latex allergy status: Secondary | ICD-10-CM | POA: Diagnosis not present

## 2018-04-20 DIAGNOSIS — R0789 Other chest pain: Secondary | ICD-10-CM | POA: Diagnosis present

## 2018-04-20 DIAGNOSIS — Z888 Allergy status to other drugs, medicaments and biological substances status: Secondary | ICD-10-CM | POA: Diagnosis not present

## 2018-04-20 DIAGNOSIS — R1011 Right upper quadrant pain: Secondary | ICD-10-CM

## 2018-04-20 DIAGNOSIS — Z885 Allergy status to narcotic agent status: Secondary | ICD-10-CM | POA: Diagnosis not present

## 2018-04-20 DIAGNOSIS — Z881 Allergy status to other antibiotic agents status: Secondary | ICD-10-CM | POA: Diagnosis not present

## 2018-04-20 DIAGNOSIS — R109 Unspecified abdominal pain: Secondary | ICD-10-CM | POA: Diagnosis present

## 2018-04-20 DIAGNOSIS — K824 Cholesterolosis of gallbladder: Secondary | ICD-10-CM | POA: Diagnosis present

## 2018-04-20 DIAGNOSIS — I129 Hypertensive chronic kidney disease with stage 1 through stage 4 chronic kidney disease, or unspecified chronic kidney disease: Secondary | ICD-10-CM | POA: Diagnosis present

## 2018-04-20 DIAGNOSIS — Z9660 Presence of unspecified orthopedic joint implant: Secondary | ICD-10-CM | POA: Diagnosis present

## 2018-04-20 DIAGNOSIS — K759 Inflammatory liver disease, unspecified: Secondary | ICD-10-CM | POA: Diagnosis present

## 2018-04-20 DIAGNOSIS — R079 Chest pain, unspecified: Secondary | ICD-10-CM | POA: Diagnosis not present

## 2018-04-20 DIAGNOSIS — Z85528 Personal history of other malignant neoplasm of kidney: Secondary | ICD-10-CM | POA: Diagnosis not present

## 2018-04-20 DIAGNOSIS — K760 Fatty (change of) liver, not elsewhere classified: Secondary | ICD-10-CM | POA: Diagnosis present

## 2018-04-20 DIAGNOSIS — Z79899 Other long term (current) drug therapy: Secondary | ICD-10-CM | POA: Diagnosis not present

## 2018-04-20 DIAGNOSIS — M549 Dorsalgia, unspecified: Secondary | ICD-10-CM | POA: Diagnosis present

## 2018-04-20 DIAGNOSIS — N183 Chronic kidney disease, stage 3 (moderate): Secondary | ICD-10-CM | POA: Diagnosis present

## 2018-04-20 LAB — URINALYSIS, COMPLETE (UACMP) WITH MICROSCOPIC
Bilirubin Urine: NEGATIVE
Glucose, UA: NEGATIVE mg/dL
Hgb urine dipstick: NEGATIVE
Ketones, ur: NEGATIVE mg/dL
Nitrite: NEGATIVE
PH: 5 (ref 5.0–8.0)
Protein, ur: NEGATIVE mg/dL
Specific Gravity, Urine: 1.027 (ref 1.005–1.030)

## 2018-04-20 LAB — CBC
HCT: 36.7 % (ref 36.0–46.0)
Hemoglobin: 11.1 g/dL — ABNORMAL LOW (ref 12.0–15.0)
MCH: 27.7 pg (ref 26.0–34.0)
MCHC: 30.2 g/dL (ref 30.0–36.0)
MCV: 91.5 fL (ref 80.0–100.0)
NRBC: 0 % (ref 0.0–0.2)
Platelets: 247 10*3/uL (ref 150–400)
RBC: 4.01 MIL/uL (ref 3.87–5.11)
RDW: 13.2 % (ref 11.5–15.5)
WBC: 4.7 10*3/uL (ref 4.0–10.5)

## 2018-04-20 LAB — COMPREHENSIVE METABOLIC PANEL
ALT: 14 U/L (ref 0–44)
AST: 16 U/L (ref 15–41)
Albumin: 3.5 g/dL (ref 3.5–5.0)
Alkaline Phosphatase: 54 U/L (ref 38–126)
Anion gap: 9 (ref 5–15)
BILIRUBIN TOTAL: 0.3 mg/dL (ref 0.3–1.2)
BUN: 24 mg/dL — ABNORMAL HIGH (ref 8–23)
CO2: 24 mmol/L (ref 22–32)
Calcium: 8.5 mg/dL — ABNORMAL LOW (ref 8.9–10.3)
Chloride: 107 mmol/L (ref 98–111)
Creatinine, Ser: 1.59 mg/dL — ABNORMAL HIGH (ref 0.44–1.00)
GFR calc Af Amer: 36 mL/min — ABNORMAL LOW (ref >60)
GFR calc non Af Amer: 31 mL/min — ABNORMAL LOW (ref >60)
Glucose, Bld: 93 mg/dL (ref 70–99)
Potassium: 4.1 mmol/L (ref 3.5–5.1)
Sodium: 140 mmol/L (ref 135–145)
Total Protein: 6.1 g/dL — ABNORMAL LOW (ref 6.5–8.1)

## 2018-04-20 LAB — ECHOCARDIOGRAM COMPLETE
HEIGHTINCHES: 65 in
Weight: 2539.2 oz

## 2018-04-20 LAB — GLUCOSE, CAPILLARY: Glucose-Capillary: 91 mg/dL (ref 70–99)

## 2018-04-20 MED ORDER — MORPHINE SULFATE (PF) 2 MG/ML IV SOLN
0.5000 mg | Freq: Four times a day (QID) | INTRAVENOUS | Status: DC | PRN
  Administered 2018-04-20 – 2018-04-21 (×2): 0.5 mg via INTRAVENOUS
  Filled 2018-04-20 (×2): qty 1

## 2018-04-20 MED ORDER — ALUM & MAG HYDROXIDE-SIMETH 200-200-20 MG/5ML PO SUSP
30.0000 mL | Freq: Four times a day (QID) | ORAL | Status: DC | PRN
  Administered 2018-04-20: 30 mL via ORAL
  Filled 2018-04-20: qty 30

## 2018-04-20 NOTE — Consult Note (Signed)
[facility] CONSULT Reason for consult: Right upper quadrant abdominal pain Referring Physician: [facility] hospitalist.  PCP: Dr. Clydene Laming year.  Primary GI: None, patient and son  Shelley Freeman is an 77 y.o. female.  HPI: She has been obtaining her medical care in [facility] approximately 2 years ago underwent a left nephrectomy for renal cell carcinoma.  She was having some vague symptoms following her surgery and reports that she went to see a GI doctor in [facility] and had a EGD about 1 year ago and they did a gallbladder ultrasound and sent her to [facility] where they did another procedure of some sort.  She says that everything was fine and that she had scar tissue.  She takes omeprazole for reflux on a as needed basis.  She has been doing fairly well over the past year until fairly acute onset of symptoms while eating dinner at Thanksgiving.  This started out his chest pain under her right breast and in her right upper quadrant.  It radiated to the back with no associated nausea or vomiting and seem to be unrelated to eating bowel movements etc.  She states if anything certain positions made it worse.  Never went away but did wax and wane and has been approximately 7-8/10 and she came to the emergency room.  Pain is been treated with narcotics.Routine lab work revealed normal lipase and LFTs.  CBC was normal with a white count of 5.3.Ultrasound of the abdomen showed a possible polyp near the neck of the gallbladder with no evidence of gallstones and a normal CBD.CT of the abdomen showed no acute process with fullness of the right renal collecting system unchanged since 2017 CT of the chest showed no active cardiopulmonary disease and no pathology to account for her right-sided chest pain,  hepatobiliary scan with ejection fraction is been ordered but has not of yet been done She does not have very many chronic problems.  She has had a left nephrectomy due to renal cell carcinoma, with  intermittent reflux.  Past Medical History:  Diagnosis Date  . Cancer ([place])   . Hypertension     Past Surgical History:  Procedure Laterality Date  . ABDOMINAL HYSTERECTOMY    . JOINT REPLACEMENT    . NEPHRECTOMY Left     History reviewed. No pertinent family history.  Social History:  reports that she has never smoked. She has never used smokeless tobacco. She reports that she does not drink alcohol or use drugs.  Allergies:  Allergies  Allergen Reactions  . Atorvastatin Other (See Comments)    Severe Myalgia   . Latex Swelling, Rash and Other (See Comments)    Skin sores, swelling at areas of contact  . Warfarin Rash  . Alendronate Nausea And Vomiting  . Ezetimibe Nausea And Vomiting       . Fenofibrate Micronized Other (See Comments)    Myalgia, could not walK    . Brimonidine Other (See Comments)    Severe red eyes and keratitis  . Codeine Nausea Only  . Demerol [Meperidine Hcl] Nausea Only  . Hydroxyzine Other (See Comments)    Makes head feel fuzzy, unable to function  . Lipitor [Atorvastatin Calcium] Other (See Comments)    Muscle weakness  . Sertraline Diarrhea  . Cefdinir Other (See Comments)    Patient not sure     Medications; Prior to Admission medications   Medication Sig Start Date End Date Taking? Authorizing Provider  amLODipine (NORVASC) 5  MG tablet Take 5 mg by mouth daily. 03/21/18  Yes [provider]  Cholecalciferol (VITAMIN D3 PO) Take 1 tablet by mouth daily.   Yes [provider]  dorzolamide-timolol (COSOPT) 22.3-6.8 MG/ML ophthalmic solution Place 1 drop into both eyes 2 (two) times daily. 04/02/18  Yes [provider]  latanoprost (XALATAN) 0.005 % ophthalmic solution Place 1 drop into both eyes at bedtime. 04/02/18  Yes [provider]   . heparin injection (subcutaneous)  5,000 Units Subcutaneous Q8H  . ondansetron (ZOFRAN) IV  4 mg Intravenous Q6H   PRN Meds acetaminophen **OR**  acetaminophen, bisacodyl, morphine injection, ondansetron **OR** ondansetron (ZOFRAN) IV, oxyCODONE, senna-docusate Results for orders placed or performed during the hospital encounter of 04/19/18 (from the past 48 hour(s))  Troponin I - ONCE - STAT     Status: None   Collection Time: 04/19/18  8:54 AM  Result Value Ref Range   Troponin I <0.03 <0.03 ng/mL    Comment: Performed at [facility], [address] [place], [place] [address]  Comprehensive metabolic panel     Status: Abnormal   Collection Time: 04/19/18  8:54 AM  Result Value Ref Range   Sodium 137 135 - 145 mmol/L   Potassium 3.9 3.5 - 5.1 mmol/L   Chloride 108 98 - 111 mmol/L   CO2 20 (L) 22 - 32 mmol/L   Glucose, Bld 102 (H) 70 - 99 mg/dL   BUN 24 (H) 8 - 23 mg/dL   Creatinine, Ser 1.43 (H) 0.44 - 1.00 mg/dL   Calcium 9.2 8.9 - 10.3 mg/dL   Total Protein 7.2 6.5 - 8.1 g/dL   Albumin 4.1 3.5 - 5.0 g/dL   AST 17 15 - 41 U/L   ALT 13 0 - 44 U/L   Alkaline Phosphatase 65 38 - 126 U/L   Total Bilirubin 0.9 0.3 - 1.2 mg/dL   GFR calc non Af Amer 36 (L) >60 mL/min   GFR calc Af Amer 41 (L) >60 mL/min   Anion gap 9 5 - 15    Comment: Performed at [facility], [address] [place], [place] [address]  CBC with Differential     Status: None   Collection Time: 04/19/18  8:54 AM  Result Value Ref Range   WBC 5.3 4.0 - 10.5 K/uL   RBC 4.65 3.87 - 5.11 MIL/uL   Hemoglobin 12.9 12.0 - 15.0 g/dL   HCT 41.2 36.0 - 46.0 %   MCV 88.6 80.0 - 100.0 fL   MCH 27.7 26.0 - 34.0 pg   MCHC 31.3 30.0 - 36.0 g/dL   RDW 13.1 11.5 - 15.5 %   Platelets 274 150 - 400 K/uL   nRBC 0.0 0.0 - 0.2 %   Neutrophils Relative % 61 %   Neutro Abs 3.2 1.7 - 7.7 K/uL   Lymphocytes Relative 27 %   Lymphs Abs 1.4 0.7 - 4.0 K/uL   Monocytes Relative 9 %   Monocytes Absolute 0.5 0.1 - 1.0 K/uL   Eosinophils Relative 2 %   Eosinophils Absolute 0.1 0.0 - 0.5 K/uL   Basophils Relative 1 %   Basophils Absolute 0.1 0.0 - 0.1 K/uL    Immature Granulocytes 0 %   Abs Immature Granulocytes 0.01 0.00 - 0.07 K/uL    Comment: Performed at [facility], [address] [place], [place] [address]  Lipase, blood     Status: None   Collection Time:  04/19/18  8:54 AM  Result Value Ref Range   Lipase 40 11 - 51 U/L    Comment: Performed at [facility], [address] [place], [place] [address]  Troponin I - Now Then Q6H     Status: None   Collection Time: 04/19/18  3:24 PM  Result Value Ref Range   Troponin I <0.03 <0.03 ng/mL    Comment: Performed at [facility], [address] [place], [place] [address]  D-dimer, quantitative (not at [facility])     Status: None   Collection Time: 04/19/18  3:24 PM  Result Value Ref Range   D-Dimer, Quant 0.40 0.00 - 0.50 ug/mL-FEU    Comment: (NOTE) At the manufacturer cut-off of 0.50 ug/mL FEU, this assay has been documented to exclude PE with a sensitivity and negative predictive value of 97 to 99%.  At this time, this assay has not been approved by the FDA to exclude DVT/VTE. Results should be correlated with clinical presentation. Performed at [facility], [address], [place], [place] [address]   Magnesium     Status: None   Collection Time: 04/19/18  7:56 PM  Result Value Ref Range   Magnesium 2.2 1.7 - 2.4 mg/dL    Comment: Performed at [facility], [place] [address], [place], [place] [address]  Phosphorus     Status: None   Collection Time: 04/19/18  7:56 PM  Result Value Ref Range   Phosphorus 4.6 2.5 - 4.6 mg/dL    Comment: Performed at [facility], [place] [address] [place], [place] [address]  TSH     Status: None   Collection Time: 04/19/18  7:56 PM  Result Value Ref Range   TSH 2.484 0.350 - 4.500 uIU/mL    Comment: Performed by a 3rd Generation assay with a functional sensitivity of <=0.01 uIU/mL. Performed at [facility] [place] [address], [place], [place]  [address]   Urinalysis, Complete w Microscopic     Status: Abnormal   Collection Time: 04/20/18 12:38 AM  Result Value Ref Range   Color, Urine YELLOW YELLOW   APPearance HAZY (A) CLEAR   Specific Gravity, Urine 1.027 1.005 - 1.030   pH 5.0 5.0 - 8.0   Glucose, UA NEGATIVE NEGATIVE mg/dL   Hgb urine dipstick NEGATIVE NEGATIVE   Bilirubin Urine NEGATIVE NEGATIVE   Ketones, ur NEGATIVE NEGATIVE mg/dL   Protein, ur NEGATIVE NEGATIVE mg/dL   Nitrite NEGATIVE NEGATIVE   Leukocytes, UA LARGE (A) NEGATIVE   RBC / HPF 11-20 0 - 5 RBC/hpf   WBC, UA 21-50 0 - 5 WBC/hpf   Bacteria, UA RARE (A) NONE SEEN   Squamous Epithelial / LPF 11-20 0 - 5   Mucus PRESENT    Hyaline Casts, UA PRESENT     Comment: Performed at [facility], [place] [address] [place],  [address]  Comprehensive metabolic panel     Status: Abnormal   Collection Time: 04/20/18  6:01 AM  Result Value Ref Range   Sodium 140 135 - 145 mmol/L   Potassium 4.1 3.5 - 5.1 mmol/L   Chloride 107 98 - 111 mmol/L   CO2 24 22 - 32 mmol/L   Glucose, Bld 93 70 - 99 mg/dL   BUN 24 (H) 8 - 23 mg/dL   Creatinine, Ser 1.59 (H) 0.44 - 1.00 mg/dL   Calcium 8.5 (L) 8.9 - 10.3 mg/dL  Total Protein 6.1 (L) 6.5 - 8.1 g/dL   Albumin 3.5 3.5 - 5.0 g/dL   AST 16 15 - 41 U/L   ALT 14 0 - 44 U/L   Alkaline Phosphatase 54 38 - 126 U/L   Total Bilirubin 0.3 0.3 - 1.2 mg/dL   GFR calc non Af Amer 31 (L) >60 mL/min   GFR calc Af Amer 36 (L) >60 mL/min   Anion gap 9 5 - 15    Comment: Performed at [facility], [place] [address], [place], [place] [address]  CBC     Status: Abnormal   Collection Time: 04/20/18  6:01 AM  Result Value Ref Range   WBC 4.7 4.0 - 10.5 K/uL   RBC 4.01 3.87 - 5.11 MIL/uL   Hemoglobin 11.1 (L) 12.0 - 15.0 g/dL   HCT 36.7 36.0 - 46.0 %   MCV 91.5 80.0 - 100.0 fL   MCH 27.7 26.0 - 34.0 pg   MCHC 30.2 30.0 - 36.0 g/dL   RDW 13.2 11.5 - 15.5 %   Platelets 247 150 - 400 K/uL   nRBC 0.0  0.0 - 0.2 %    Comment: Performed at [facility], [place] [address] [place], [place] [address]  Glucose, capillary     Status: None   Collection Time: 04/20/18  7:40 AM  Result Value Ref Range   Glucose-Capillary 91 70 - 99 mg/dL    Ct Abdomen Pelvis Wo Contrast  Result Date: 04/19/2018 CLINICAL DATA:  Right upper quadrant and flank pain. EXAM: CT ABDOMEN AND PELVIS WITHOUT CONTRAST TECHNIQUE: Multidetector CT imaging of the abdomen and pelvis was performed following the standard protocol without IV contrast. COMPARISON:  Right upper quadrant ultrasound from same day. CT abdomen pelvis dated October 25, 2017. FINDINGS: Lower chest: No acute abnormality. Subsegmental atelectasis in both lower lobes. Hepatobiliary: No focal liver abnormality is seen. No gallstones, gallbladder wall thickening, or biliary dilatation. Pancreas: Unremarkable. No pancreatic ductal dilatation or surrounding inflammatory changes. Spleen: Normal in size without focal abnormality. Adrenals/Urinary Tract: The adrenal glands are unremarkable. Prior left nephrectomy. Unchanged right renal cysts. Fullness of the right renal collecting system to the level of the UPJ is unchanged since 2017. No renal or ureteral calculi. No hydronephrosis. The bladder is unremarkable. Stomach/Bowel: Stomach is within normal limits. Appendix appears normal. No evidence of bowel wall thickening, distention, or inflammatory changes. Vascular/Lymphatic: Aortic atherosclerosis. No enlarged abdominal or pelvic lymph nodes. Reproductive: Status post hysterectomy. No adnexal masses. Other: No free fluid or pneumoperitoneum. Musculoskeletal: No acute or significant osseous findings. IMPRESSION: 1.  No acute intra-abdominal process. 2. Fullness of the right renal collecting system to the level of the UPJ is unchanged since 2017. No obstructive uropathy. 3.  Aortic atherosclerosis (ICD10-I70.0). Electronically Signed   By: Titus Dubin M.D.   On:  04/19/2018 13:08   Dg Chest 2 View  Result Date: 04/19/2018 CLINICAL DATA:  Right-sided chest pain for 9 days. EXAM: CHEST - 2 VIEW COMPARISON:  None. FINDINGS: The heart size and mediastinal contours are within normal limits. Both lungs are clear. The visualized skeletal structures are unremarkable. IMPRESSION: No active cardiopulmonary disease. Electronically Signed   By: Earle Gell M.D.   On: 04/19/2018 09:51   Ct Chest Wo Contrast  Result Date: 04/19/2018 CLINICAL DATA:  Right chest pain EXAM: CT CHEST WITHOUT CONTRAST TECHNIQUE: Multidetector CT imaging of the chest was performed following the standard protocol without IV contrast. COMPARISON:  Chest radiographs dated  04/19/2018 FINDINGS: Cardiovascular: The heart is normal in size. No pericardial effusion. No evidence of thoracic aortic aneurysm. Atherosclerotic calcifications of the aortic arch. Mediastinum/Nodes: Small mediastinal lymph nodes which do not meet pathologic CT size criteria. Visualized thyroid is unremarkable. Lungs/Pleura: No suspicious pulmonary nodules. 4 x 6 mm perifissural nodule along the right minor fissure (series 7/image 58), benign. Mild linear scarring/atelectasis in the anterior right lower lobe (series 7/image 66). No focal consolidation. Mild subpleural reticulation with dependent atelectasis in the bilateral lower lobes. No pleural effusion or pneumothorax. Upper Abdomen: Visualized upper abdomen is grossly unremarkable, noting vascular calcifications. Musculoskeletal: Visualized osseous structures are within normal limits. IMPRESSION: No evidence of acute cardiopulmonary disease. No CT findings to account for the patient's right chest pain. Aortic Atherosclerosis (ICD10-I70.0). Electronically Signed   By: Julian Hy M.D.   On: 04/19/2018 19:11   US Abdomen Limited Ruq  Result Date: 04/19/2018 CLINICAL DATA:  Epigastric abdominal pain. EXAM: ULTRASOUND ABDOMEN LIMITED RIGHT UPPER QUADRANT COMPARISON:  None.  FINDINGS: Gallbladder: Suspect polyp or fold near the gallbladder neck. No shadowing gallstones or findings for acute cholecystitis. Common bile duct: Diameter: 7.0 mm Liver: Somewhat heterogeneous coarse liver echogenicity but no focal hepatic lesions or intrahepatic biliary dilatation. Other: The right kidney demonstrates hydronephrosis. There are also simple appearing right renal cysts. Portal vein is patent on color Doppler imaging with normal direction of blood flow towards the liver. IMPRESSION: 1. Suspect polyp or fold near the gallbladder neck but no shadowing gallstones or findings for acute cholecystitis. 2. Normal caliber common bile duct for age. 3. Somewhat heterogeneous coarse liver echogenicity could be due to fatty infiltration or hepatitis. No obvious changes of cirrhosis. 4. Right-sided hydronephrosis of uncertain etiology. Electronically Signed   By: Marijo Sanes M.D.   On: 04/19/2018 11:50               Blood pressure (!) 128/58, pulse (!) 53, temperature 98.3 F (36.8 C), temperature source Oral, resp. rate 16, height 5\' 5"  (1.651 m), weight 72 kg, SpO2 94 %.  Physical exam:   General--Pleasant white female in no distress ENT--nonicteric Neck--supple with no lymphadenopathy Heart--regular rate and rhythm without murmurs or gallops Lungs--clear Abdomen--nondistended and soft with minimal if any right upper quadrant tenderness.    Assessment: 1.  Right upper quadrant/right chest pain.  Slight abnormality on gallbladder ultrasound.  Think it is reasonable to go ahead and get her ejection fraction.  Her symptoms however somewhat unusual and that the pain is constant worsened by motion unaffected by eating.  Plan: We will check on the results of the hepatobiliary scan with ejection fraction.  Dr. Watt Climes will check on patient tomorrow.   Nancy Fetter 04/20/2018, 5:15 PM   This note was created using voice recognition software and minor errors may Have occurred  unintentionally. Pager: [phone] If no answer or after hours call [phone]

## 2018-04-20 NOTE — Progress Notes (Signed)
  Echocardiogram 2D Echocardiogram has been performed.  Shelley Freeman 04/20/2018, 8:58 AM

## 2018-04-20 NOTE — Progress Notes (Signed)
PROGRESS NOTE    Shelley Freeman  YBW:[id number] DOB: 04-Feb-1941 DOA: 04/19/2018 PCP: Burman Freestone, MD    Brief Narrative;  77 year old with past medical history significant for renal cell carcinoma status post left nephrectomy, chronic kidney disease a stage III, care who presents complaining of right side chest pain, right upper quadrant pain radiating to her back.  Having pain since Thanksgiving.  Denies worsening pain on exertion or after meals.    Assessment & Plan:   Active Problems:   Chest pain   1-Chest pain;  Patient complaining of chest pain, requiring IV pain medication. D-dimer negative. CT chest unrevealing. Liver function test, lipase.  Normal Right upper quadrant ultrasound; suspect polyp near the gallbladder neck but no shadowing gallstone or finding for acute cholecystitis.. Normal caliber of common bile duct.  Fatty infiltration or hepatitis. Plan for HIDA scan today. GI consulted.  Chronic kidney disease a stage III. Prior creatinine from 1.3-1.5. Continue with IV fluids.  History of RCC status post left nephrectomy. [facility] outpatient. CT abdomen with ;Fullness of the right renal collecting system to the level of the UPJ was unchanged since 2017 and no obstructive uropathy.  Hyperglycemia;  Hemoglobin A1c pending.   Self   RN Pressure Injury Documentation:    Malnutrition Type:      Malnutrition Characteristics:      Nutrition Interventions:     Estimated body mass index is 26.41 kg/m as calculated from the following:   Height as of this encounter: 5\' 5"  (1.651 m).   Weight as of this encounter: 72 kg.   DVT prophylaxis: SCD Code Status: full code.  Family Communication: care discussed with patient.  Disposition Plan: needs to complete work up  Consultants:   GI   Procedures: [place]; . Suspect polyp or fold near the gallbladder neck but no shadowing gallstones or findings for acute cholecystitis. 2. Normal caliber common bile  duct for age. 3. Somewhat heterogeneous coarse liver echogenicity could be due to fatty infiltration or hepatitis. No obvious changes of cirrhosis.  4. Right-sided hydronephrosis of uncertain etiology.    Antimicrobials: none Subjective: She is still complaining of severe chest pain, 8/10. She cant not stand pain   Objective: Vitals:   04/19/18 1700 04/19/18 2137 04/20/18 0438 04/20/18 1212  BP: 129/62 (!) 115/59 (!) 113/59 (!) 128/58  Pulse: 64 69 (!) 52 (!) 53  Resp: 14 16 16 16   Temp: 97.7 F (36.5 C) 98.4 F (36.9 C) 99 F (37.2 C) 98.3 F (36.8 C)  TempSrc: Oral Oral Oral Oral  SpO2: 98% 92% 91% 94%  Weight: 70.8 kg  72 kg   Height: 5\' 5"  (1.651 m)       Intake/Output Summary (Last 24 hours) at 04/20/2018 1350 Last data filed at 04/20/2018 1040 Gross per 24 hour  Intake 1487.47 ml  Output 525 ml  Net 962.47 ml   Filed Weights   04/19/18 0848 04/19/18 1700 04/20/18 0438  Weight: 68 kg 70.8 kg 72 kg    Examination:  General exam: Appears calm and comfortable  Respiratory system: Clear to auscultation. Respiratory effort normal. Cardiovascular system: S1 & S2 heard, RRR. No JVD, murmurs, rubs, gallops or clicks. No pedal edema. Gastrointestinal system; BS present, soft, right upper quadrant tenderness Central nervous system: Alert and oriented. No focal neurological deficits. Extremities: Symmetric 5 x 5 power. Skin: No rashes, lesions or ulcers Psychiatry: Judgement and insight appear normal. Mood & affect appropriate.     Data Reviewed: I have personally  reviewed following labs and imaging studies  CBC: Recent Labs  Lab 04/19/18 0854 04/20/18 0601  WBC 5.3 4.7  NEUTROABS 3.2  --   HGB 12.9 11.1*  HCT 41.2 36.7  MCV 88.6 91.5  PLT 274 161   Basic Metabolic Panel: Recent Labs  Lab 04/19/18 0854 04/19/18 1956 04/20/18 0601  NA 137  --  140  K 3.9  --  4.1  CL 108  --  107  CO2 20*  --  24  GLUCOSE 102*  --  93  BUN 24*  --  24*    CREATININE 1.43*  --  1.59*  CALCIUM 9.2  --  8.5*  MG  --  2.2  --   PHOS  --  4.6  --    GFR: Estimated Creatinine Clearance: 30.4 mL/min (A) (by C-G formula based on SCr of 1.59 mg/dL (H)). Liver Function Tests: Recent Labs  Lab 04/19/18 0854 04/20/18 0601  AST 17 16  ALT 13 14  ALKPHOS 65 54  BILITOT 0.9 0.3  PROT 7.2 6.1*  ALBUMIN 4.1 3.5   Recent Labs  Lab 04/19/18 0854  LIPASE 40   No results for input(s): AMMONIA in the last 168 hours. Coagulation Profile: No results for input(s): INR, PROTIME in the last 168 hours. Cardiac Enzymes: Recent Labs  Lab 04/19/18 0854 04/19/18 1524  TROPONINI <0.03 <0.03   BNP (last 3 results) No results for input(s): PROBNP in the last 8760 hours. HbA1C: No results for input(s): HGBA1C in the last 72 hours. CBG: Recent Labs  Lab 04/20/18 0740  GLUCAP 91   Lipid Profile: No results for input(s): CHOL, HDL, LDLCALC, TRIG, CHOLHDL, LDLDIRECT in the last 72 hours. Thyroid Function Tests: Recent Labs    04/19/18 1956  TSH 2.484   Anemia Panel: No results for input(s): VITAMINB12, FOLATE, FERRITIN, TIBC, IRON, RETICCTPCT in the last 72 hours. Sepsis Labs: No results for input(s): PROCALCITON, LATICACIDVEN in the last 168 hours.  No results found for this or any previous visit (from the past 240 hour(s)).       Radiology Studies: Ct Abdomen Pelvis Wo Contrast  Result Date: 04/19/2018 CLINICAL DATA:  Right upper quadrant and flank pain. EXAM: CT ABDOMEN AND PELVIS WITHOUT CONTRAST TECHNIQUE: Multidetector CT imaging of the abdomen and pelvis was performed following the standard protocol without IV contrast. COMPARISON:  Right upper quadrant ultrasound from same day. CT abdomen pelvis dated October 25, 2017. FINDINGS: Lower chest: No acute abnormality. Subsegmental atelectasis in both lower lobes. Hepatobiliary: No focal liver abnormality is seen. No gallstones, gallbladder wall thickening, or biliary dilatation. Pancreas:  Unremarkable. No pancreatic ductal dilatation or surrounding inflammatory changes. Spleen: Normal in size without focal abnormality. Adrenals/Urinary Tract: The adrenal glands are unremarkable. Prior left nephrectomy. Unchanged right renal cysts. Fullness of the right renal collecting system to the level of the UPJ is unchanged since 2017. No renal or ureteral calculi. No hydronephrosis. The bladder is unremarkable. Stomach/Bowel: Stomach is within normal limits. Appendix appears normal. No evidence of bowel wall thickening, distention, or inflammatory changes. Vascular/Lymphatic: Aortic atherosclerosis. No enlarged abdominal or pelvic lymph nodes. Reproductive: Status post hysterectomy. No adnexal masses. Other: No free fluid or pneumoperitoneum. Musculoskeletal: No acute or significant osseous findings. IMPRESSION: 1.  No acute intra-abdominal process. 2. Fullness of the right renal collecting system to the level of the UPJ is unchanged since 2017. No obstructive uropathy. 3.  Aortic atherosclerosis (ICD10-I70.0). Electronically Signed   By: Orville Govern.D.  On: 04/19/2018 13:08   Dg Chest 2 View  Result Date: 04/19/2018 CLINICAL DATA:  Right-sided chest pain for 9 days. EXAM: CHEST - 2 VIEW COMPARISON:  None. FINDINGS: The heart size and mediastinal contours are within normal limits. Both lungs are clear. The visualized skeletal structures are unremarkable. IMPRESSION: No active cardiopulmonary disease. Electronically Signed   By: Earle Gell M.D.   On: 04/19/2018 09:51   Ct Chest Wo Contrast  Result Date: 04/19/2018 CLINICAL DATA:  Right chest pain EXAM: CT CHEST WITHOUT CONTRAST TECHNIQUE: Multidetector CT imaging of the chest was performed following the standard protocol without IV contrast. COMPARISON:  Chest radiographs dated 04/19/2018 FINDINGS: Cardiovascular: The heart is normal in size. No pericardial effusion. No evidence of thoracic aortic aneurysm. Atherosclerotic calcifications of the  aortic arch. Mediastinum/Nodes: Small mediastinal lymph nodes which do not meet pathologic CT size criteria. Visualized thyroid is unremarkable. Lungs/Pleura: No suspicious pulmonary nodules. 4 x 6 mm perifissural nodule along the right minor fissure (series 7/image 58), benign. Mild linear scarring/atelectasis in the anterior right lower lobe (series 7/image 66). No focal consolidation. Mild subpleural reticulation with dependent atelectasis in the bilateral lower lobes. No pleural effusion or pneumothorax. Upper Abdomen: Visualized upper abdomen is grossly unremarkable, noting vascular calcifications. Musculoskeletal: Visualized osseous structures are within normal limits. IMPRESSION: No evidence of acute cardiopulmonary disease. No CT findings to account for the patient's right chest pain. Aortic Atherosclerosis (ICD10-I70.0). Electronically Signed   By: Julian Hy M.D.   On: 04/19/2018 19:11   US Abdomen Limited Ruq  Result Date: 04/19/2018 CLINICAL DATA:  Epigastric abdominal pain. EXAM: ULTRASOUND ABDOMEN LIMITED RIGHT UPPER QUADRANT COMPARISON:  None. FINDINGS: Gallbladder: Suspect polyp or fold near the gallbladder neck. No shadowing gallstones or findings for acute cholecystitis. Common bile duct: Diameter: 7.0 mm Liver: Somewhat heterogeneous coarse liver echogenicity but no focal hepatic lesions or intrahepatic biliary dilatation. Other: The right kidney demonstrates hydronephrosis. There are also simple appearing right renal cysts. Portal vein is patent on color Doppler imaging with normal direction of blood flow towards the liver. IMPRESSION: 1. Suspect polyp or fold near the gallbladder neck but no shadowing gallstones or findings for acute cholecystitis. 2. Normal caliber common bile duct for age. 3. Somewhat heterogeneous coarse liver echogenicity could be due to fatty infiltration or hepatitis. No obvious changes of cirrhosis. 4. Right-sided hydronephrosis of uncertain etiology.  Electronically Signed   By: Marijo Sanes M.D.   On: 04/19/2018 11:50        Scheduled Meds: . heparin injection (subcutaneous)  5,000 Units Subcutaneous Q8H  . ondansetron (ZOFRAN) IV  4 mg Intravenous Q6H   Continuous Infusions: . sodium chloride 75 mL/hr at 04/20/18 0920     LOS: 0 days    Time spent: 35 minutes.     Elmarie Shiley, MD [facility] Pager [phone]  If 7PM-7AM, please contact night-coverage [email or web address] Password [email or web address] 04/20/2018, 1:50 PM

## 2018-04-21 ENCOUNTER — Inpatient Hospital Stay (HOSPITAL_COMMUNITY): Payer: Medicare Other

## 2018-04-21 LAB — HEPATIC FUNCTION PANEL
ALK PHOS: 60 U/L (ref 38–126)
ALT: 13 U/L (ref 0–44)
AST: 15 U/L (ref 15–41)
Albumin: 3.7 g/dL (ref 3.5–5.0)
Bilirubin, Direct: 0.1 mg/dL (ref 0.0–0.2)
Indirect Bilirubin: 0.9 mg/dL (ref 0.3–0.9)
Total Bilirubin: 1 mg/dL (ref 0.3–1.2)
Total Protein: 6.2 g/dL — ABNORMAL LOW (ref 6.5–8.1)

## 2018-04-21 LAB — BASIC METABOLIC PANEL
Anion gap: 8 (ref 5–15)
BUN: 16 mg/dL (ref 8–23)
CO2: 25 mmol/L (ref 22–32)
Calcium: 8.6 mg/dL — ABNORMAL LOW (ref 8.9–10.3)
Chloride: 105 mmol/L (ref 98–111)
Creatinine, Ser: 1.5 mg/dL — ABNORMAL HIGH (ref 0.44–1.00)
GFR calc non Af Amer: 34 mL/min — ABNORMAL LOW (ref >60)
GFR, EST AFRICAN AMERICAN: 39 mL/min — AB (ref >60)
Glucose, Bld: 103 mg/dL — ABNORMAL HIGH (ref 70–99)
Potassium: 4.1 mmol/L (ref 3.5–5.1)
Sodium: 138 mmol/L (ref 135–145)

## 2018-04-21 LAB — CBC
HCT: 36.7 % (ref 36.0–46.0)
Hemoglobin: 11.5 g/dL — ABNORMAL LOW (ref 12.0–15.0)
MCH: 28.5 pg (ref 26.0–34.0)
MCHC: 31.3 g/dL (ref 30.0–36.0)
MCV: 91.1 fL (ref 80.0–100.0)
Platelets: 218 10*3/uL (ref 150–400)
RBC: 4.03 MIL/uL (ref 3.87–5.11)
RDW: 12.8 % (ref 11.5–15.5)
WBC: 6.4 10*3/uL (ref 4.0–10.5)
nRBC: 0 % (ref 0.0–0.2)

## 2018-04-21 LAB — GLUCOSE, CAPILLARY: GLUCOSE-CAPILLARY: 111 mg/dL — AB (ref 70–99)

## 2018-04-21 LAB — HEMOGLOBIN A1C
HEMOGLOBIN A1C: 5.5 % (ref 4.8–5.6)
Mean Plasma Glucose: 111.15 mg/dL

## 2018-04-21 MED ORDER — PROMETHAZINE HCL 25 MG PO TABS: 12.5000 mg | ORAL_TABLET | Freq: Four times a day (QID) | ORAL | Status: DC | PRN

## 2018-04-21 MED ORDER — LATANOPROST 0.005 % OP SOLN
1.0000 [drp] | Freq: Every day | OPHTHALMIC | Status: DC
  Administered 2018-04-22: 1 [drp] via OPHTHALMIC
  Filled 2018-04-21: qty 2.5

## 2018-04-21 MED ORDER — DORZOLAMIDE HCL-TIMOLOL MAL 2-0.5 % OP SOLN
1.0000 [drp] | Freq: Two times a day (BID) | OPHTHALMIC | Status: DC
  Administered 2018-04-21 – 2018-04-22 (×3): 1 [drp] via OPHTHALMIC
  Filled 2018-04-21: qty 10

## 2018-04-21 MED ORDER — TECHNETIUM TC 99M MEBROFENIN IV KIT
5.3700 | PACK | Freq: Once | INTRAVENOUS | Status: AC | PRN
  Administered 2018-04-21: 5.37 via INTRAVENOUS

## 2018-04-21 MED ORDER — PROMETHAZINE HCL 25 MG/ML IJ SOLN
12.5000 mg | Freq: Four times a day (QID) | INTRAMUSCULAR | Status: DC | PRN
  Administered 2018-04-21 (×2): 12.5 mg via INTRAVENOUS
  Filled 2018-04-21 (×2): qty 1

## 2018-04-21 MED ORDER — PROMETHAZINE HCL 25 MG RE SUPP: 12.5000 mg | Freq: Four times a day (QID) | RECTAL | Status: DC | PRN

## 2018-04-21 MED ORDER — PANTOPRAZOLE SODIUM 40 MG PO TBEC
40.0000 mg | DELAYED_RELEASE_TABLET | Freq: Every day | ORAL | Status: DC
  Administered 2018-04-21 – 2018-04-22 (×2): 40 mg via ORAL
  Filled 2018-04-21 (×2): qty 1

## 2018-04-21 NOTE — Progress Notes (Signed)
PROGRESS NOTE    Shelley Freeman  MWN:[id number] DOB: [birth year] DOA: 04/19/2018 PCP: Burman Freestone, MD    Brief Narrative;  77 year old with past medical history significant for renal cell carcinoma status post left nephrectomy, chronic kidney disease a stage III, care who presents complaining of right side chest pain, right upper quadrant pain radiating to her back.  Having pain since Thanksgiving.  Denies worsening pain on exertion or after meals.    Assessment & Plan:   Active Problems:   Chest pain   1-Chest pain;  Patient complaining of chest pain, requiring IV pain medication. D-dimer negative. CT chest unrevealing. Liver function test, lipase.  Normal Right upper quadrant ultrasound; suspect polyp near the gallbladder neck but no shadowing gallstone or finding for acute cholecystitis.. Normal caliber of common bile duct.  Fatty infiltration or hepatitis. HIDA scan negative for cholecystitis, EF was not able to be done.  GI consulted. She is still having severe chest pain today and nausea. She has required IV phenergan.   Chronic kidney disease a stage III. Prior creatinine from 1.3-1.5. Continue with IV fluids. Repeat labs in am.   History of RCC status post left nephrectomy. [facility] outpatient. CT abdomen with ;Fullness of the right renal collecting system to the level of the UPJ was unchanged since 2017 and no obstructive uropathy.  Hyperglycemia;  Hemoglobin A1c 5.5 Suspect stress related.    RN Pressure Injury Documentation:    Malnutrition Type:      Malnutrition Characteristics:      Nutrition Interventions:     Estimated body mass index is 26.56 kg/m as calculated from the following:   Height as of this encounter: 5\' 5"  (1.651 m).   Weight as of this encounter: 72.4 kg.   DVT prophylaxis: SCD Code Status: full code.  Family Communication: care discussed with patient.  Disposition Plan: needs to complete work up  Consultants:    GI   Procedures: [place]; . Suspect polyp or fold near the gallbladder neck but no shadowing gallstones or findings for acute cholecystitis. 2. Normal caliber common bile duct for age. 3. Somewhat heterogeneous coarse liver echogenicity could be due to fatty infiltration or hepatitis. No obvious changes of cirrhosis.  4. Right-sided hydronephrosis of uncertain etiology.    Antimicrobials: none Subjective: She is having severe chest pain, has nausea. Vomited in nuclear medicine.    Objective: Vitals:   04/20/18 2124 04/21/18 0358 04/21/18 0500 04/21/18 1300  BP:  (!) 138/57  (!) 150/65  Pulse:  68  64  Resp:  16  14  Temp:  98.6 F (37 C)  99.1 F (37.3 C)  TempSrc:  Oral  Oral  SpO2: 98% 92%  96%  Weight:   72.4 kg   Height:        Intake/Output Summary (Last 24 hours) at 04/21/2018 1643 Last data filed at 04/21/2018 0753 Gross per 24 hour  Intake 1458.34 ml  Output 2850 ml  Net -1391.66 ml   Filed Weights   04/19/18 1700 04/20/18 0438 04/21/18 0500  Weight: 70.8 kg 72 kg 72.4 kg    Examination:  General exam: sleepy  Respiratory system: CTA Cardiovascular system: S 1, S 2 RRR Gastrointestinal system; BS present, soft, mild tenderness Central nervous system: sleepy  Extremities: symmetric power.  Skin: no rashes.    Data Reviewed: I have personally reviewed following labs and imaging studies  CBC: Recent Labs  Lab 04/19/18 0854 04/20/18 0601 04/21/18 0807  WBC 5.3 4.7 6.4  NEUTROABS  3.2  --   --   HGB 12.9 11.1* 11.5*  HCT 41.2 36.7 36.7  MCV 88.6 91.5 91.1  PLT 274 247 989   Basic Metabolic Panel: Recent Labs  Lab 04/19/18 0854 04/19/18 1956 04/20/18 0601 04/21/18 0807  NA 137  --  140 138  K 3.9  --  4.1 4.1  CL 108  --  107 105  CO2 20*  --  24 25  GLUCOSE 102*  --  93 103*  BUN 24*  --  24* 16  CREATININE 1.43*  --  1.59* 1.50*  CALCIUM 9.2  --  8.5* 8.6*  MG  --  2.2  --   --   PHOS  --  4.6  --   --    GFR: Estimated  Creatinine Clearance: 32.3 mL/min (A) (by C-G formula based on SCr of 1.5 mg/dL (H)). Liver Function Tests: Recent Labs  Lab 04/19/18 0854 04/20/18 0601 04/21/18 0807  AST 17 16 15   ALT 13 14 13   ALKPHOS 65 54 60  BILITOT 0.9 0.3 1.0  PROT 7.2 6.1* 6.2*  ALBUMIN 4.1 3.5 3.7   Recent Labs  Lab 04/19/18 0854  LIPASE 40   No results for input(s): AMMONIA in the last 168 hours. Coagulation Profile: No results for input(s): INR, PROTIME in the last 168 hours. Cardiac Enzymes: Recent Labs  Lab 04/19/18 0854 04/19/18 1524  TROPONINI <0.03 <0.03   BNP (last 3 results) No results for input(s): PROBNP in the last 8760 hours. HbA1C: Recent Labs    04/20/18 0601  HGBA1C 5.5   CBG: Recent Labs  Lab 04/20/18 0740 04/21/18 0959  GLUCAP 91 111*   Lipid Profile: No results for input(s): CHOL, HDL, LDLCALC, TRIG, CHOLHDL, LDLDIRECT in the last 72 hours. Thyroid Function Tests: Recent Labs    04/19/18 1956  TSH 2.484   Anemia Panel: No results for input(s): VITAMINB12, FOLATE, FERRITIN, TIBC, IRON, RETICCTPCT in the last 72 hours. Sepsis Labs: No results for input(s): PROCALCITON, LATICACIDVEN in the last 168 hours.  No results found for this or any previous visit (from the past 240 hour(s)).       Radiology Studies: Ct Chest Wo Contrast  Result Date: 04/19/2018 CLINICAL DATA:  Right chest pain EXAM: CT CHEST WITHOUT CONTRAST TECHNIQUE: Multidetector CT imaging of the chest was performed following the standard protocol without IV contrast. COMPARISON:  Chest radiographs dated 04/19/2018 FINDINGS: Cardiovascular: The heart is normal in size. No pericardial effusion. No evidence of thoracic aortic aneurysm. Atherosclerotic calcifications of the aortic arch. Mediastinum/Nodes: Small mediastinal lymph nodes which do not meet pathologic CT size criteria. Visualized thyroid is unremarkable. Lungs/Pleura: No suspicious pulmonary nodules. 4 x 6 mm perifissural nodule along the  right minor fissure (series 7/image 58), benign. Mild linear scarring/atelectasis in the anterior right lower lobe (series 7/image 66). No focal consolidation. Mild subpleural reticulation with dependent atelectasis in the bilateral lower lobes. No pleural effusion or pneumothorax. Upper Abdomen: Visualized upper abdomen is grossly unremarkable, noting vascular calcifications. Musculoskeletal: Visualized osseous structures are within normal limits. IMPRESSION: No evidence of acute cardiopulmonary disease. No CT findings to account for the patient's right chest pain. Aortic Atherosclerosis (ICD10-I70.0). Electronically Signed   By: Julian Hy M.D.   On: 04/19/2018 19:11   Nm Hepatobiliary Liver Func  Result Date: 04/21/2018 CLINICAL DATA:  Right upper quadrant abdominal pain, nausea and vomiting for a few weeks. EXAM: NUCLEAR MEDICINE HEPATOBILIARY IMAGING TECHNIQUE: Sequential images of the abdomen were obtained out  to 60 minutes following intravenous administration of radiopharmaceutical. RADIOPHARMACEUTICALS:  5.37 mCi Tc-48m  Choletec IV COMPARISON:  CT scan 04/19/2018 FINDINGS: Prompt and symmetric uptake in the liver is demonstrated with visualization of the biliary tree by 15 minutes. The gallbladder is visualized at 20 minutes. No definite activity was seen in the small bowel by 55 minutes. The patient was given in short a drink but immediately vomited. We're unable to get a gallbladder EF. A delayed static image was obtained and does demonstrate activity in the small bowel consistent with patency of the common bile duct. IMPRESSION: 1. Patent cystic duct and common bile duct. 2. Gallbladder EF was not obtained due to patient vomiting the Ensure. Electronically Signed   By: Marijo Sanes M.D.   On: 04/21/2018 11:26        Scheduled Meds: . dorzolamide-timolol  1 drop Both Eyes BID  . heparin injection (subcutaneous)  5,000 Units Subcutaneous Q8H  . latanoprost  1 drop Both Eyes QHS  .  ondansetron (ZOFRAN) IV  4 mg Intravenous Q6H  . pantoprazole  40 mg Oral Daily   Continuous Infusions: . sodium chloride 75 mL/hr at 04/21/18 0723     LOS: 1 day    Time spent: 35 minutes.     Elmarie Shiley, MD [facility] Pager [phone]  If 7PM-7AM, please contact night-coverage [email or web address] Password [email or web address] 04/21/2018, 4:43 PM

## 2018-04-21 NOTE — Progress Notes (Addendum)
Glenda Chroman 3:55 PM  Subjective: Patient reevaluated after my discussion with the hospital team and she was seen and examined and her pain is in her chest and right upper quadrant and does not change with eating and we discussed her work-up at [facility] in the past and reassurance was given about CT lab work and today's test unfortunately she vomited the Ensure so she could not proceed with an ejection fraction and she has no new complaints she denies any heavy lifting trauma etc.  Objective: Vital signs stable afebrile no acute distress lungs are clear heart regular rate and rhythm abdomen is soft nontender no chest wall tenderness  Assessment: Atypical chest pain right upper quadrant pain questionable etiology  Plan: I offered patient another endoscopy tomorrow but she declined saying she had 2 of those are ready which we discussed and we will allow her to eat and reevaluate tomorrow but hopefully she can go home for further work-up and plans by her primary doctor  [facility]  Pager [phone] After 5PM or if no answer call [phone]

## 2018-04-21 NOTE — Progress Notes (Signed)
Nuclear Medicine notified the RN that they are ready for the patient to have the NM Hepato study performed. The RN notified the patient that she would be going for her test soon.

## 2018-04-21 NOTE — Progress Notes (Addendum)
Patient getting HIDA scan not in room hospital computer chart reviewed and case discussed with Dr. Oletta Lamas if HIDA scan negative okay to go home and follow-up with her primary gastroenterologist to decide further work-up and plans and if HIDA scan positive consult general surgery for possible cholecystectomy and please call Dr. Oletta Lamas if a quick question is needed and her labs were reviewed and normal

## 2018-04-22 LAB — GLUCOSE, CAPILLARY: Glucose-Capillary: 95 mg/dL (ref 70–99)

## 2018-04-22 MED ORDER — PANTOPRAZOLE SODIUM 40 MG PO TBEC: 40.0000 mg | DELAYED_RELEASE_TABLET | Freq: Every day | ORAL | 0 refills | Status: AC

## 2018-04-22 MED ORDER — PROMETHAZINE HCL 12.5 MG PO TABS: 12.5000 mg | ORAL_TABLET | Freq: Four times a day (QID) | ORAL | 0 refills | Status: AC | PRN

## 2018-04-22 MED ORDER — TRAMADOL HCL 50 MG PO TABS: 50.0000 mg | ORAL_TABLET | Freq: Four times a day (QID) | ORAL | 0 refills | Status: AC | PRN

## 2018-04-22 NOTE — Discharge Summary (Signed)
Physician Discharge Summary  Shelley Freeman DGL:[id number] DOB: [birth year] DOA: 04/19/2018  PCP: Shelley Freestone, MD  Admit date: 04/19/2018 Discharge date: 04/23/2018  Admitted From: Home  Disposition:  Home   Recommendations for Outpatient Follow-up:  1. Follow up with PCP in 1-2 weeks 2. Please obtain BMP/CBC in one week 3. Further evaluation of chest pain by PCP , gastroenterology.  4. Could consider EF scan out patient.  5. Follow up on hydronephrosis.   Home Health: none  Discharge Condition: stable.  CODE STATUS: full code.  Diet recommendation: Heart Healthy  Brief/Interim Summary: Brief Narrative;  77 year old with past medical history significant for renal cell carcinoma status post left nephrectomy, chronic kidney disease a stage III, care who presents complaining of right side chest pain, right upper quadrant pain radiating to her back.  Having pain since Thanksgiving.  Denies worsening pain on exertion or after meals.    Assessment & Plan:   Active Problems:   Chest pain   1-Chest pain;  Patient complaining of chest pain, requiring IV pain medication. D-dimer negative. CT chest unrevealing. Liver function test, lipase.  Normal Right upper quadrant ultrasound; suspect polyp near the gallbladder neck but no shadowing gallstone or finding for acute cholecystitis.. Normal caliber of common bile duct.  Fatty infiltration or hepatitis. HIDA scan negative for cholecystitis, EF was not able to be done. could consider repeating out patient to evaluate for EF. GI consulted. She was still having severe chest pain and nausea on day 12-8 ans 12-9 of admission. . She has required IV phenergan.  Day if discharge she has improved.   Chronic kidney disease a stage III. Prior creatinine from 1.3-1.5. Continue with IV fluids. Repeat labs in am.   History of RCC status post left nephrectomy. [facility] outpatient. CT abdomen with;Fullness of the right renal collecting  system to the level of the UPJ was unchanged since 2017 and no obstructive uropathy.  Hyperglycemia;  Hemoglobin A1c 5.5 Suspect stress related.     Discharge Diagnoses:  Active Problems:   Chest pain    Discharge Instructions  Discharge Instructions    Diet - low sodium heart healthy   Complete by:  As directed    Increase activity slowly   Complete by:  As directed      Allergies as of 04/22/2018      Reactions   Atorvastatin Other (See Comments)   Severe Myalgia   Latex Swelling, Rash, Other (See Comments)   Skin sores, swelling at areas of contact   Warfarin Rash   Alendronate Nausea And Vomiting   Ezetimibe Nausea And Vomiting      Fenofibrate Micronized Other (See Comments)   Myalgia, could not walK   Brimonidine Other (See Comments)   Severe red eyes and keratitis   Codeine Nausea Only   Demerol [meperidine Hcl] Nausea Only   Hydroxyzine Other (See Comments)   Makes head feel fuzzy, unable to function   Lipitor [atorvastatin Calcium] Other (See Comments)   Muscle weakness   Sertraline Diarrhea   Cefdinir Other (See Comments)   Patient not sure      Medication List    TAKE these medications   amLODipine 5 MG tablet Commonly known as:  NORVASC Take 5 mg by mouth daily.   dorzolamide-timolol 22.3-6.8 MG/ML ophthalmic solution Commonly known as:  COSOPT Place 1 drop into both eyes 2 (two) times daily.   latanoprost 0.005 % ophthalmic solution Commonly known as:  XALATAN Place 1 drop into both eyes  at bedtime.   pantoprazole 40 MG tablet Commonly known as:  PROTONIX Take 1 tablet (40 mg total) by mouth daily.   promethazine 12.5 MG tablet Commonly known as:  PHENERGAN Take 1-2 tablets (12.5-25 mg total) by mouth every 6 (six) hours as needed for nausea (use first).   traMADol 50 MG tablet Commonly known as:  ULTRAM Take 1 tablet (50 mg total) by mouth every 6 (six) hours as needed for up to 3 days.   VITAMIN D3 PO Take 1 tablet by mouth  daily.       Allergies  Allergen Reactions  . Atorvastatin Other (See Comments)    Severe Myalgia   . Latex Swelling, Rash and Other (See Comments)    Skin sores, swelling at areas of contact  . Warfarin Rash  . Alendronate Nausea And Vomiting  . Ezetimibe Nausea And Vomiting       . Fenofibrate Micronized Other (See Comments)    Myalgia, could not walK    . Brimonidine Other (See Comments)    Severe red eyes and keratitis  . Codeine Nausea Only  . Demerol [Meperidine Hcl] Nausea Only  . Hydroxyzine Other (See Comments)    Makes head feel fuzzy, unable to function  . Lipitor [Atorvastatin Calcium] Other (See Comments)    Muscle weakness  . Sertraline Diarrhea  . Cefdinir Other (See Comments)    Patient not sure     Consultations: none  Procedures/Studies: Ct Abdomen Pelvis Wo Contrast  Result Date: 04/19/2018 CLINICAL DATA:  Right upper quadrant and flank pain. EXAM: CT ABDOMEN AND PELVIS WITHOUT CONTRAST TECHNIQUE: Multidetector CT imaging of the abdomen and pelvis was performed following the standard protocol without IV contrast. COMPARISON:  Right upper quadrant ultrasound from same day. CT abdomen pelvis dated October 25, 2017. FINDINGS: Lower chest: No acute abnormality. Subsegmental atelectasis in both lower lobes. Hepatobiliary: No focal liver abnormality is seen. No gallstones, gallbladder wall thickening, or biliary dilatation. Pancreas: Unremarkable. No pancreatic ductal dilatation or surrounding inflammatory changes. Spleen: Normal in size without focal abnormality. Adrenals/Urinary Tract: The adrenal glands are unremarkable. Prior left nephrectomy. Unchanged right renal cysts. Fullness of the right renal collecting system to the level of the UPJ is unchanged since 2017. No renal or ureteral calculi. No hydronephrosis. The bladder is unremarkable. Stomach/Bowel: Stomach is within normal limits. Appendix appears normal. No evidence of bowel wall thickening, distention,  or inflammatory changes. Vascular/Lymphatic: Aortic atherosclerosis. No enlarged abdominal or pelvic lymph nodes. Reproductive: Status post hysterectomy. No adnexal masses. Other: No free fluid or pneumoperitoneum. Musculoskeletal: No acute or significant osseous findings. IMPRESSION: 1.  No acute intra-abdominal process. 2. Fullness of the right renal collecting system to the level of the UPJ is unchanged since 2017. No obstructive uropathy. 3.  Aortic atherosclerosis (ICD10-I70.0). Electronically Signed   By: Titus Dubin M.D.   On: 04/19/2018 13:08   Dg Chest 2 View  Result Date: 04/19/2018 CLINICAL DATA:  Right-sided chest pain for 9 days. EXAM: CHEST - 2 VIEW COMPARISON:  None. FINDINGS: The heart size and mediastinal contours are within normal limits. Both lungs are clear. The visualized skeletal structures are unremarkable. IMPRESSION: No active cardiopulmonary disease. Electronically Signed   By: Earle Gell M.D.   On: 04/19/2018 09:51   Ct Chest Wo Contrast  Result Date: 04/19/2018 CLINICAL DATA:  Right chest pain EXAM: CT CHEST WITHOUT CONTRAST TECHNIQUE: Multidetector CT imaging of the chest was performed following the standard protocol without IV contrast. COMPARISON:  Chest radiographs  dated 04/19/2018 FINDINGS: Cardiovascular: The heart is normal in size. No pericardial effusion. No evidence of thoracic aortic aneurysm. Atherosclerotic calcifications of the aortic arch. Mediastinum/Nodes: Small mediastinal lymph nodes which do not meet pathologic CT size criteria. Visualized thyroid is unremarkable. Lungs/Pleura: No suspicious pulmonary nodules. 4 x 6 mm perifissural nodule along the right minor fissure (series 7/image 58), benign. Mild linear scarring/atelectasis in the anterior right lower lobe (series 7/image 66). No focal consolidation. Mild subpleural reticulation with dependent atelectasis in the bilateral lower lobes. No pleural effusion or pneumothorax. Upper Abdomen: Visualized  upper abdomen is grossly unremarkable, noting vascular calcifications. Musculoskeletal: Visualized osseous structures are within normal limits. IMPRESSION: No evidence of acute cardiopulmonary disease. No CT findings to account for the patient's right chest pain. Aortic Atherosclerosis (ICD10-I70.0). Electronically Signed   By: Julian Hy M.D.   On: 04/19/2018 19:11   Nm Hepatobiliary Liver Func  Result Date: 04/21/2018 CLINICAL DATA:  Right upper quadrant abdominal pain, nausea and vomiting for a few weeks. EXAM: NUCLEAR MEDICINE HEPATOBILIARY IMAGING TECHNIQUE: Sequential images of the abdomen were obtained out to 60 minutes following intravenous administration of radiopharmaceutical. RADIOPHARMACEUTICALS:  5.37 mCi Tc-67m  Choletec IV COMPARISON:  CT scan 04/19/2018 FINDINGS: Prompt and symmetric uptake in the liver is demonstrated with visualization of the biliary tree by 15 minutes. The gallbladder is visualized at 20 minutes. No definite activity was seen in the small bowel by 55 minutes. The patient was given in short a drink but immediately vomited. We're unable to get a gallbladder EF. A delayed static image was obtained and does demonstrate activity in the small bowel consistent with patency of the common bile duct. IMPRESSION: 1. Patent cystic duct and common bile duct. 2. Gallbladder EF was not obtained due to patient vomiting the Ensure. Electronically Signed   By: Marijo Sanes M.D.   On: 04/21/2018 11:26   US Abdomen Limited Ruq  Result Date: 04/19/2018 CLINICAL DATA:  Epigastric abdominal pain. EXAM: ULTRASOUND ABDOMEN LIMITED RIGHT UPPER QUADRANT COMPARISON:  None. FINDINGS: Gallbladder: Suspect polyp or fold near the gallbladder neck. No shadowing gallstones or findings for acute cholecystitis. Common bile duct: Diameter: 7.0 mm Liver: Somewhat heterogeneous coarse liver echogenicity but no focal hepatic lesions or intrahepatic biliary dilatation. Other: The right kidney demonstrates  hydronephrosis. There are also simple appearing right renal cysts. Portal vein is patent on color Doppler imaging with normal direction of blood flow towards the liver. IMPRESSION: 1. Suspect polyp or fold near the gallbladder neck but no shadowing gallstones or findings for acute cholecystitis. 2. Normal caliber common bile duct for age. 3. Somewhat heterogeneous coarse liver echogenicity could be due to fatty infiltration or hepatitis. No obvious changes of cirrhosis. 4. Right-sided hydronephrosis of uncertain etiology. Electronically Signed   By: Marijo Sanes M.D.   On: 04/19/2018 11:50     Subjective: She is feeling some what better. nausea improved.   Discharge Exam: Vitals:   04/21/18 2106 04/22/18 0512  BP: 139/62 (!) 153/78  Pulse: 64 81  Resp: 18 18  Temp: 98.7 F (37.1 C) 98.1 F (36.7 C)  SpO2: 93% 95%   Vitals:   04/21/18 1300 04/21/18 1735 04/21/18 2106 04/22/18 0512  BP: (!) 150/65  139/62 (!) 153/78  Pulse: 64  64 81  Resp: 14  18 18   Temp: 99.1 F (37.3 C) 98.9 F (37.2 C) 98.7 F (37.1 C) 98.1 F (36.7 C)  TempSrc: Oral Oral Oral Oral  SpO2: 96%  93% 95%  Weight:  Height:        General: Pt is alert, awake, not in acute distress Cardiovascular: RRR, S1/S2 +, no rubs, no gallops Respiratory: CTA bilaterally, no wheezing, no rhonchi Abdominal: Soft, NT, ND, bowel sounds + Extremities: no edema, no cyanosis    The results of significant diagnostics from this hospitalization (including imaging, microbiology, ancillary and laboratory) are listed below for reference.     Microbiology: No results found for this or any previous visit (from the past 240 hour(s)).   Labs: BNP (last 3 results) No results for input(s): BNP in the last 8760 hours. Basic Metabolic Panel: Recent Labs  Lab 04/19/18 0854 04/19/18 1956 04/20/18 0601 04/21/18 0807  NA 137  --  140 138  K 3.9  --  4.1 4.1  CL 108  --  107 105  CO2 20*  --  24 25  GLUCOSE 102*  --  93  103*  BUN 24*  --  24* 16  CREATININE 1.43*  --  1.59* 1.50*  CALCIUM 9.2  --  8.5* 8.6*  MG  --  2.2  --   --   PHOS  --  4.6  --   --    Liver Function Tests: Recent Labs  Lab 04/19/18 0854 04/20/18 0601 04/21/18 0807  AST 17 16 15   ALT 13 14 13   ALKPHOS 65 54 60  BILITOT 0.9 0.3 1.0  PROT 7.2 6.1* 6.2*  ALBUMIN 4.1 3.5 3.7   Recent Labs  Lab 04/19/18 0854  LIPASE 40   No results for input(s): AMMONIA in the last 168 hours. CBC: Recent Labs  Lab 04/19/18 0854 04/20/18 0601 04/21/18 0807  WBC 5.3 4.7 6.4  NEUTROABS 3.2  --   --   HGB 12.9 11.1* 11.5*  HCT 41.2 36.7 36.7  MCV 88.6 91.5 91.1  PLT 274 247 218   Cardiac Enzymes: Recent Labs  Lab 04/19/18 0854 04/19/18 1524  TROPONINI <0.03 <0.03   BNP: Invalid input(s): POCBNP CBG: Recent Labs  Lab 04/20/18 0740 04/21/18 0959 04/22/18 0731  GLUCAP 91 111* 95   D-Dimer No results for input(s): DDIMER in the last 72 hours. Hgb A1c No results for input(s): HGBA1C in the last 72 hours. Lipid Profile No results for input(s): CHOL, HDL, LDLCALC, TRIG, CHOLHDL, LDLDIRECT in the last 72 hours. Thyroid function studies No results for input(s): TSH, T4TOTAL, T3FREE, THYROIDAB in the last 72 hours.  Invalid input(s): FREET3 Anemia work up No results for input(s): VITAMINB12, FOLATE, FERRITIN, TIBC, IRON, RETICCTPCT in the last 72 hours. Urinalysis    Component Value Date/Time   COLORURINE YELLOW 04/20/2018 0038   APPEARANCEUR HAZY (A) 04/20/2018 0038   LABSPEC 1.027 04/20/2018 0038   PHURINE 5.0 04/20/2018 0038   GLUCOSEU NEGATIVE 04/20/2018 0038   HGBUR NEGATIVE 04/20/2018 0038   BILIRUBINUR NEGATIVE 04/20/2018 0038   KETONESUR NEGATIVE 04/20/2018 0038   PROTEINUR NEGATIVE 04/20/2018 0038   NITRITE NEGATIVE 04/20/2018 0038   LEUKOCYTESUR LARGE (A) 04/20/2018 0038   Sepsis Labs Invalid input(s): PROCALCITONIN,  WBC,  LACTICIDVEN Microbiology No results found for this or any previous visit (from  the past 240 hour(s)).   Time coordinating discharge: 34 minutes.   SIGNED:   Elmarie Shiley, MD  [facility] 04/23/2018, 11:55 AM Pager   If 7PM-7AM, please contact night-coverage [email or web address] Password [email or web address]

## 2019-05-31 ENCOUNTER — Ambulatory Visit: Payer: Medicare Other | Attending: Internal Medicine

## 2019-05-31 DIAGNOSIS — Z23 Encounter for immunization: Secondary | ICD-10-CM | POA: Insufficient documentation

## 2019-05-31 NOTE — Progress Notes (Signed)
   Covid-19 Vaccination Clinic  Name:  Shelley Freeman    MRN: [id number] DOB: [birth year]  05/31/2019  [name] was observed post Covid-19 immunization for 15 minutes without incidence. She was provided with Vaccine Information Sheet and instruction to access the V-Safe system.   [name] was instructed to call 911 with any severe reactions post vaccine: Marland Kitchen Difficulty breathing  . Swelling of your face and throat  . A fast heartbeat  . A bad rash all over your body  . Dizziness and weakness

## 2019-06-19 ENCOUNTER — Ambulatory Visit: Payer: Medicare Other | Attending: Internal Medicine

## 2019-06-19 DIAGNOSIS — Z23 Encounter for immunization: Secondary | ICD-10-CM | POA: Insufficient documentation

## 2019-06-19 NOTE — Progress Notes (Signed)
   Covid-19 Vaccination Clinic  Name:  Shelley Freeman    MRN: [id number] DOB: 02-07-1941  06/19/2019  Shelley Freeman was observed post Covid-19 immunization for 15 minutes without incidence. She was provided with Vaccine Information Sheet and instruction to access the V-Safe system.   Shelley Freeman was instructed to call 911 with any severe reactions post vaccine: Marland Kitchen Difficulty breathing  . Swelling of your face and throat  . A fast heartbeat  . A bad rash all over your body  . Dizziness and weakness    Immunizations Administered    Name Date Dose VIS Date Route   Pfizer COVID-19 Vaccine 06/19/2019  9:25 AM 0.3 mL 04/24/2019 Intramuscular   Manufacturer: [place]   Lot: [id number]   [place]: [id number]

## 2019-07-28 IMAGING — NM NM HEPATOBILIARY IMAGE, INC GB
1 series · 6 of 6 positions shown · non-contrast
Comparison: CT scan 04/19/2018

CLINICAL DATA: Right upper quadrant abdominal pain, nausea and
vomiting for a few weeks.

EXAM:
NUCLEAR MEDICINE HEPATOBILIARY IMAGING
TECHNIQUE: Sequential images of the abdomen were obtained [DATE] minutes
following intravenous administration of radiopharmaceutical.
RADIOPHARMACEUTICALS:  5.37 mCi 2c-XXm  Choletec IV

[Series 1: raw data · 4.46mm/px · 6 of 60 frames shown]
[frame 6/60]
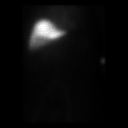
[frame 16/60]
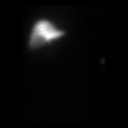
[frame 26/60]
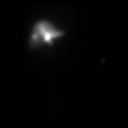
[frame 36/60]
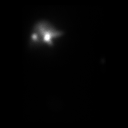
[frame 46/60]
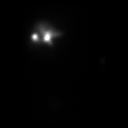
[frame 56/60]
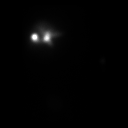

[6 of 6 positions shown; findings below may reference images not displayed]

FINDINGS: Prompt and symmetric uptake in the liver is demonstrated with
visualization of the biliary tree by 15 minutes. The gallbladder is
visualized at 20 minutes. No definite activity was seen in the small
bowel by 55 minutes. The patient was given in short a drink but
immediately vomited. We're unable to get a gallbladder EF.

A delayed static image was obtained and does demonstrate activity in
the small bowel consistent with patency of the common bile duct.
IMPRESSION: 1. Patent cystic duct and common bile duct.
2. Gallbladder EF was not obtained due to patient vomiting the
Ensure.

## 2022-11-12 DEATH — deceased
